# Patient Record
Sex: Male | Born: 1985 | Race: White | Hispanic: No | Marital: Married | Smoking: Never smoker
Health system: Southern US, Community
[De-identification: ages and names within clinical notes are randomized; demographics above are authoritative.]

## PROBLEM LIST (undated history)

## (undated) DIAGNOSIS — I471 Supraventricular tachycardia, unspecified: Secondary | ICD-10-CM

## (undated) DIAGNOSIS — F419 Anxiety disorder, unspecified: Secondary | ICD-10-CM

## (undated) DIAGNOSIS — E785 Hyperlipidemia, unspecified: Secondary | ICD-10-CM

## (undated) HISTORY — DX: Hyperlipidemia, unspecified: E78.5

---

## 2012-07-17 ENCOUNTER — Other Ambulatory Visit: Payer: Self-pay | Admitting: Family Medicine

## 2012-07-17 ENCOUNTER — Ambulatory Visit
Admission: RE | Admit: 2012-07-17 | Discharge: 2012-07-17 | Disposition: A | Discharge status: Home / Self Care | Payer: Worker's Compensation | Source: Ambulatory Visit | Attending: Family Medicine | Admitting: Family Medicine

## 2012-07-17 DIAGNOSIS — R52 Pain, unspecified: Secondary | ICD-10-CM

## 2013-04-15 ENCOUNTER — Emergency Department: Payer: Self-pay | Admitting: Emergency Medicine

## 2014-07-07 ENCOUNTER — Ambulatory Visit: Payer: Self-pay

## 2014-07-07 ENCOUNTER — Other Ambulatory Visit: Payer: Self-pay | Admitting: Occupational Medicine

## 2014-07-07 DIAGNOSIS — M25562 Pain in left knee: Secondary | ICD-10-CM

## 2015-05-20 ENCOUNTER — Encounter (HOSPITAL_COMMUNITY): Payer: Self-pay | Admitting: *Deleted

## 2015-05-20 ENCOUNTER — Emergency Department (HOSPITAL_COMMUNITY)
Admission: EM | Admit: 2015-05-20 | Discharge: 2015-05-20 | Disposition: A | Discharge status: Home / Self Care | Payer: No Typology Code available for payment source | Attending: Emergency Medicine | Admitting: Emergency Medicine

## 2015-05-20 DIAGNOSIS — I471 Supraventricular tachycardia: Secondary | ICD-10-CM | POA: Insufficient documentation

## 2015-05-20 DIAGNOSIS — Z79899 Other long term (current) drug therapy: Secondary | ICD-10-CM | POA: Insufficient documentation

## 2015-05-20 DIAGNOSIS — F419 Anxiety disorder, unspecified: Secondary | ICD-10-CM | POA: Diagnosis not present

## 2015-05-20 DIAGNOSIS — Z880 Allergy status to penicillin: Secondary | ICD-10-CM | POA: Diagnosis not present

## 2015-05-20 DIAGNOSIS — R000 Tachycardia, unspecified: Secondary | ICD-10-CM | POA: Diagnosis present

## 2015-05-20 HISTORY — DX: Supraventricular tachycardia, unspecified: I47.10

## 2015-05-20 HISTORY — DX: Anxiety disorder, unspecified: F41.9

## 2015-05-20 HISTORY — DX: Supraventricular tachycardia: I47.1

## 2015-05-20 LAB — BASIC METABOLIC PANEL
ANION GAP: 8 (ref 5–15)
BUN: 10 mg/dL (ref 6–20)
CALCIUM: 8.9 mg/dL (ref 8.9–10.3)
CO2: 25 mmol/L (ref 22–32)
Chloride: 107 mmol/L (ref 101–111)
Creatinine, Ser: 0.85 mg/dL (ref 0.61–1.24)
GFR calc Af Amer: >60 mL/min (ref >60)
GFR calc non Af Amer: >60 mL/min (ref >60)
GLUCOSE: 89 mg/dL (ref 65–99)
POTASSIUM: 3.5 mmol/L (ref 3.5–5.1)
Sodium: 140 mmol/L (ref 135–145)

## 2015-05-20 LAB — I-STAT CHEM 8, ED
BUN: 12 mg/dL (ref 6–20)
CALCIUM ION: 1.18 mmol/L (ref 1.12–1.23)
Chloride: 105 mmol/L (ref 101–111)
Creatinine, Ser: 0.9 mg/dL (ref 0.61–1.24)
Glucose, Bld: 85 mg/dL (ref 65–99)
HCT: 45 % (ref 39.0–52.0)
HEMOGLOBIN: 15.3 g/dL (ref 13.0–17.0)
POTASSIUM: 3.5 mmol/L (ref 3.5–5.1)
SODIUM: 142 mmol/L (ref 135–145)
TCO2: 22 mmol/L (ref 0–100)

## 2015-05-20 LAB — CBC
HEMATOCRIT: 42.2 % (ref 39.0–52.0)
HEMOGLOBIN: 16.2 g/dL (ref 13.0–17.0)
MCH: 31.6 pg (ref 26.0–34.0)
MCHC: 38.4 g/dL — ABNORMAL HIGH (ref 30.0–36.0)
MCV: 82.4 fL (ref 78.0–100.0)
Platelets: 249 10*3/uL (ref 150–400)
RBC: 5.12 MIL/uL (ref 4.22–5.81)
RDW: 13.7 % (ref 11.5–15.5)
WBC: 10.8 10*3/uL — ABNORMAL HIGH (ref 4.0–10.5)

## 2015-05-20 LAB — I-STAT TROPONIN, ED: TROPONIN I, POC: 0 ng/mL (ref 0.00–0.08)

## 2015-05-20 NOTE — ED Notes (Signed)
Pt with hx of svt squatted down and experienced chest pain and felt his heart racing.  When EMS arrived pt hr was 177.  Pt was encouraged to drink 3 L water.  As soon as pt began drinking his HR dropped.  Orthostatic positive per EMS.  1L NS infusing.

## 2015-05-20 NOTE — ED Notes (Signed)
Pt stood up to use urinal.  HR elevated only to 102, ST.

## 2015-05-20 NOTE — ED Provider Notes (Signed)
CSN: [id number]     Arrival date & time 05/20/15  1306 History   First MD Initiated Contact with Patient 05/20/15 1321     Chief Complaint  Patient presents with  . Tachycardia  . Chest Pain     (Consider location/radiation/quality/duration/timing/severity/associated sxs/prior Treatment) HPI [name] is a 29 y.o. male with a past medical hx of anxiety and supraventricular tachycardia presenting to the emergency department for chest pain and tachycardia.  Pt states that while he was working he bent down to pick up something and upon standing immediately felt chest pain described as dull with a feeling like his heart was in his throat.  States he could feel his heart racing and was associated with dizziness and lightheadedness.  States he never lost consciousness but symptoms remained until he was in the ambulance and drinking water.  Past medical hx does include anxiety attacks but states that this was different than past anxiety attacks because of sudden onset.  He does have a prior history of SVT for which he was seen once before in the emergency room.  States he followed up with cardiology for this and was told that it was likely related to dehydration.  Pt denies any nausea or vomiting, shortness of breath, swelling in his extremity.    Past Medical History  Diagnosis Date  . SVT (supraventricular tachycardia)   . Anxiety    History reviewed. No pertinent past surgical history. No family history on file. History  Substance Use Topics  . Smoking status: Never Smoker   . Smokeless tobacco: Not on file  . Alcohol Use: No    Review of Systems  Constitutional: Negative for fever and chills.  Respiratory: Negative for cough and shortness of breath.   Cardiovascular: Positive for chest pain and palpitations. Negative for leg swelling.  Gastrointestinal: Negative for nausea, vomiting and abdominal pain.  Genitourinary: Negative for difficulty urinating.  Neurological: Positive  for dizziness and light-headedness. Negative for syncope and headaches.  All other systems reviewed and are negative.     Allergies  Penicillins  Home Medications   Prior to Admission medications   Medication Sig Start Date End Date Taking? Authorizing Provider  escitalopram (LEXAPRO) 10 MG tablet Take 1 tablet by mouth daily. 05/18/15  Yes Historical Provider, MD  omeprazole (PRILOSEC) 20 MG capsule Take 20 mg by mouth daily.   Yes Historical Provider, MD   BP 117/47 mmHg  Pulse 87  Temp(Src) 98.3 F (36.8 C) (Oral)  Resp 19  Ht 5' 8.5" (1.74 m)  Wt 311 lb (141.069 kg)  BMI 46.59 kg/m2  SpO2 99% Physical Exam  Constitutional: He appears well-developed and well-nourished.  HENT:  Head: Normocephalic and atraumatic.  Eyes: EOM are normal.  Cardiovascular: Normal rate, regular rhythm and intact distal pulses.   Pulmonary/Chest: Effort normal and breath sounds normal. He has no wheezes. He has no rales.  Abdominal: Soft. Bowel sounds are normal.  Musculoskeletal: He exhibits no edema.  Neurological: He is alert. No cranial nerve deficit.  Skin: Skin is warm and intact.  Psychiatric: He has a normal mood and affect. His speech is normal and behavior is normal.    ED Course  Procedures (including critical care time) Labs Review Labs Reviewed  BASIC METABOLIC PANEL  CBC  I-STAT CHEM 8, ED  I-STAT TROPOININ, ED    Imaging Review No results found.   EKG Interpretation None      MDM   Final diagnoses:  None  29 y.o. male with past medical history of anxiety and SVT presenting with tachycardia and chest pain.  Per EMS, pt heart rate was 177 on arrival with subsequent drop after patient started drinking water.  Currently pt is not tachycardic, not experiencing chest pain or shortness of breath.  He is receiving IV fluids.  Initial troponin negative, BMP unremarkable.  Plan for patient to be discharged to home with recommendation for close follow up outpatient with  cardiology.    Gwynn Burly, DO 05/20/15 1437  Nelva Nay, MD 05/20/15 [phone]

## 2015-05-29 NOTE — Progress Notes (Signed)
Cardiology Office Note   Date:  05/30/2015   ID:  Edward Atkins, DOB [birth year], MRN [id number]  PCP:  No primary care provider on file.  Cardiologist:   Madilyn Hook, MD   Chief Complaint  Patient presents with  . Follow-up    SVT  . Leg Pain    burning on the back of lower legs; pt states no other Sx since hospital      History of Present Illness: Edward Atkins is a 29 y.o. male with anxiety and SVT who presents for hospital follow up.  [name] was at work 2 weeks ago and developed an episode of tachycardia after lifting a heavy barrel. The time he felt lightheaded and dizzy as well as short of breath. He did not pass out. The episode lasted approximately 30 minutes. EMS was called and transported him to the emergency department.  Upon their arrival his HR was reportedly 177 bpm.  The SVT broke with drinking water.  Upon arrival in the ED his HR was 87 bpm and sinus.  He was hemodynamically stable. He was thought to be slightly dehydrated at the time.  [name] brought a copy of his heart rhythm on his cell phone that shows a long R-P tachycardia at a rate slightly over 150 bpm.  It broke with a PAC.  [name] had one episode of SVT 6 years ago.  He previously followed up with Cardiology and underwent treadmill stress testing that was reportedly normal. He was told that this was a fluke accident and that it would likely never occur again. He did not start any preventative medication at that time.    [name] weight has been stable. He tries to eat fruits and vegetables but admits that his diet could be improved. Since SVT episode he is trying to cut back on sodas as he was drinking Coke regularly. He is currently drinking tea water and juice. 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   Past Medical History  Diagnosis Date  . SVT (supraventricular tachycardia)   . Anxiety   . Diabetes mellitus  without complication mother and father  . Hyperlipidemia mother  . Hypertension father    No past surgical history on file.   Current Outpatient Prescriptions  Medication Sig Dispense Refill  . escitalopram (LEXAPRO) 10 MG tablet Take 1 tablet by mouth daily.    Marland Kitchen omeprazole (PRILOSEC) 20 MG capsule Take 20 mg by mouth daily.     No current facility-administered medications for this visit.    Allergies:   Penicillins    Social History:  The patient  reports that he has never smoked. He does not have any smokeless tobacco history on file. He reports that he does not drink alcohol or use illicit drugs.   Family History:  The patient's family history includes Heart attack in his maternal grandfather; Hyperlipidemia in his mother; Hypertension in his father.    ROS:  Please see the history of present illness.   Otherwise, review of systems are positive for bilateral leg pain at after work.  Bilateral shoulder pain with work.   All other systems are reviewed and negative.    PHYSICAL EXAM: VS:  BP 130/80 mmHg  Pulse 59  Ht  (1.727 m)  Wt 141.477 kg (311 lb 14.4 oz)  BMI 47.44 kg/m2 , BMI Body mass index is 47.44 kg/(m^2). GENERAL:  Well  appearing.  Obese HEENT:  Pupils equal round and reactive, fundi not visualized, oral mucosa unremarkable NECK:  No jugular venous distention, waveform within normal limits, carotid upstroke brisk and symmetric, no bruits, no thyromegaly LYMPHATICS:  No cervical adenopathy LUNGS:  Clear to auscultation bilaterally HEART:  RRR.  PMI not displaced or sustained,S1 and S2 within normal limits, no S3, no S4, no clicks, no rubs, no murmurs ABD:  Flat, positive bowel sounds normal in frequency in pitch, no bruits, no rebound, no guarding, no midline pulsatile mass, no hepatomegaly, no splenomegaly EXT:  2 plus pulses throughout, no edema, no cyanosis no clubbing SKIN:  No rashes no nodules NEURO:  Cranial nerves II through XII grossly intact, motor  grossly intact throughout PSYCH:  Cognitively intact, oriented to person place and time    EKG:  EKG is ordered today. The ekg ordered today demonstrates sinus bradycardia at 59 bpm.     Recent Labs: 05/20/2015: BUN 12; Creatinine, Ser 0.90; Hemoglobin 15.3; Platelets 249; Potassium 3.5; Sodium 142    Lipid Panel No results found for: CHOL, TRIG, HDL, CHOLHDL, VLDL, LDLCALC, LDLDIRECT    Wt Readings from Last 3 Encounters:  05/30/15 141.477 kg (311 lb 14.4 oz)  05/20/15 141.069 kg (311 lb)      Other studies Reviewed: Additional studies/ records that were reviewed today include: cell phone ECG, medical record. Review of the above records demonstrates:  Please see elsewhere in the note.     ASSESSMENT AND PLAN:  # SVT: This is his second episode of SVT, with the first one occurring 6 years ago.  Caffeine and dehydration may have been precipitants.  He has cut back on the Coke, and I reminded him that tea can have caffeine as well.  We discussed the three options of watch and wait, prophylactic medication, and ablation.  He prefers to try metoprolol.  We discussed the possible side effects of fatigue and sexual dysfunction.  We also discussed the use of vagal maneuvers.   - metoprolol tartrate 12.5mg  bid - reduce caffeine intake  # Morbid obesity: We discussed the need to lose weight.  Patient is working to increase exercise and improve diet.  We reviewed the recommendation for at least 30-40 minutes of vigorous exercise most days of the week.   Current medicines are reviewed at length with the patient today.  The patient does not have concerns regarding medicines.  The following changes have been made:  Start metoprolol  Labs/ tests ordered today include: none  Orders Placed This Encounter  Procedures  . EKG 12-Lead     Disposition:   FU with Dr. Elmarie Shiley C.  in 6 months    Signed, Madilyn Hook, MD  05/30/2015 9:27 AM    [facility]

## 2015-05-30 ENCOUNTER — Encounter: Payer: Self-pay | Admitting: Cardiovascular Disease

## 2015-05-30 ENCOUNTER — Ambulatory Visit (INDEPENDENT_AMBULATORY_CARE_PROVIDER_SITE_OTHER): Payer: No Typology Code available for payment source | Admitting: Cardiovascular Disease

## 2015-05-30 VITALS — BP 130/80 | HR 59 | Ht 68.0 in | Wt 311.9 lb

## 2015-05-30 DIAGNOSIS — I471 Supraventricular tachycardia: Secondary | ICD-10-CM

## 2015-05-30 MED ORDER — METOPROLOL TARTRATE 25 MG PO TABS: 12.5000 mg | ORAL_TABLET | Freq: Two times a day (BID) | ORAL | Status: DC

## 2015-05-30 NOTE — Patient Instructions (Signed)
Start metoprolol tartrate  12.5 mg ( 1/2 of a 25 mg tablet)   Twice a day.   Your physician wants you to follow-up in 6 months Dr Duke Salvia.  You will receive a reminder letter in the mail two months in advance. If you don't receive a letter, please call our office to schedule the follow-up appointment.

## 2015-12-01 ENCOUNTER — Ambulatory Visit: Payer: Self-pay | Admitting: Cardiovascular Disease

## 2015-12-13 ENCOUNTER — Encounter: Payer: Self-pay | Admitting: Cardiovascular Disease

## 2015-12-13 NOTE — Progress Notes (Signed)
Cardiology Office Note   Date:  12/13/2015   ID:  Edward Atkins, DOB [birth year], MRN [id number]  PCP:  No primary care provider on file.  Cardiologist:   Madilyn Hook, MD   No chief complaint on file.     Patient ID: Edward Atkins is a 30 y.o. male with anxiety and SVT who presents for hospital follow up.     Interval History 12/13/15: At his last appointment Edward Atkins was advised to.  ?diet and exercise  History of Present Illness 05/30/15: Edward Atkins was at work 2 weeks ago and developed an episode of tachycardia after lifting a heavy barrel. The time he felt lightheaded and dizzy as well as short of breath. He did not pass out. The episode lasted approximately 30 minutes. EMS was called and transported him to the emergency department.  Upon their arrival his HR was reportedly 177 bpm.  The SVT broke with drinking water.  Upon arrival in the ED his HR was 87 bpm and sinus.  He was hemodynamically stable. He was thought to be slightly dehydrated at the time.  Edward Atkins brought a copy of his heart rhythm on his cell phone that shows a long R-P tachycardia at a rate slightly over 150 bpm.  It broke with a PAC.  Edward Atkins had one episode of SVT 6 years ago.  He previously followed up with Cardiology and underwent treadmill stress testing that was reportedly normal. He was told that this was a fluke accident and that it would likely never occur again. He did not start any preventative medication at that time.    Edward Atkins weight has been stable. He tries to eat fruits and vegetables but admits that his diet could be improved. Since SVT episode he is trying to cut back on sodas as he was drinking Coke regularly. He is currently drinking tea water and juice. 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   Past Medical History  Diagnosis Date  . SVT (supraventricular tachycardia)   . Anxiety   . Diabetes mellitus  without complication mother and father  . Hyperlipidemia mother  . Hypertension father    No past surgical history on file.   Current Outpatient Prescriptions  Medication Sig Dispense Refill  . escitalopram (LEXAPRO) 10 MG tablet Take 1 tablet by mouth daily.    . metoprolol tartrate (LOPRESSOR) 25 MG tablet Take 0.5 tablets (12.5 mg total) by mouth 2 (two) times daily. 90 tablet 3  . omeprazole (PRILOSEC) 20 MG capsule Take 20 mg by mouth daily.     No current facility-administered medications for this visit.    Allergies:   Penicillins    Social History:  The patient  reports that he has never smoked. He does not have any smokeless tobacco history on file. He reports that he does not drink alcohol or use illicit drugs.   Family History:  The patient's family history includes Heart attack in his maternal grandfather; Hyperlipidemia in his mother; Hypertension in his father.    ROS:  Please see the history of present illness.   Otherwise, review of systems are positive for bilateral leg pain at after work.  Bilateral shoulder pain with work.   All other systems are reviewed and negative.    PHYSICAL EXAM: VS:  There were no vitals taken for this visit. , BMI There is no weight on file to calculate BMI.  GENERAL:  Well appearing.  Obese HEENT:  Pupils equal round and reactive, fundi not visualized, oral mucosa unremarkable NECK:  No jugular venous distention, waveform within normal limits, carotid upstroke brisk and symmetric, no bruits, no thyromegaly LYMPHATICS:  No cervical adenopathy LUNGS:  Clear to auscultation bilaterally HEART:  RRR.  PMI not displaced or sustained,S1 and S2 within normal limits, no S3, no S4, no clicks, no rubs, no murmurs ABD:  Flat, positive bowel sounds normal in frequency in pitch, no bruits, no rebound, no guarding, no midline pulsatile mass, no hepatomegaly, no splenomegaly EXT:  2 plus pulses throughout, no edema, no cyanosis no clubbing SKIN:  No  rashes no nodules NEURO:  Cranial nerves II through XII grossly intact, motor grossly intact throughout PSYCH:  Cognitively intact, oriented to person place and time    EKG:  EKG is ordered today. The ekg ordered today demonstrates sinus bradycardia at 59 bpm.     Recent Labs: 05/20/2015: BUN 12; Creatinine, Ser 0.90; Hemoglobin 15.3; Platelets 249; Potassium 3.5; Sodium 142    Lipid Panel No results found for: CHOL, TRIG, HDL, CHOLHDL, VLDL, LDLCALC, LDLDIRECT    Wt Readings from Last 3 Encounters:  05/30/15 141.477 kg (311 lb 14.4 oz)  05/20/15 141.069 kg (311 lb)      Other studies Reviewed: Additional studies/ records that were reviewed today include: cell phone ECG, medical record. Review of the above records demonstrates:  Please see elsewhere in the note.     ASSESSMENT AND PLAN:  # SVT: This is his second episode of SVT, with the first one occurring 6 years ago.  Caffeine and dehydration may have been precipitants.  He has cut back on the Coke, and I reminded him that tea can have caffeine as well.  We discussed the three options of watch and wait, prophylactic medication, and ablation.  He prefers to try metoprolol.  We discussed the possible side effects of fatigue and sexual dysfunction.  We also discussed the use of vagal maneuvers.   - metoprolol tartrate 12.5mg  bid - reduce caffeine intake  # Morbid obesity: We discussed the need to lose weight.  Patient is working to increase exercise and improve diet.  We reviewed the recommendation for at least 30-40 minutes of vigorous exercise most days of the week.   Current medicines are reviewed at length with the patient today.  The patient does not have concerns regarding medicines.  The following changes have been made:  Start metoprolol  Labs/ tests ordered today include: none  No orders of the defined types were placed in this encounter.     Disposition:   FU with Dr. Elmarie Shiley C. [place] in 6 months     Signed, Madilyn Hook, MD  12/13/2015 7:58 AM    [facility] This encounter was created in error - please disregard.

## 2015-12-14 ENCOUNTER — Encounter: Payer: Self-pay | Admitting: *Deleted

## 2015-12-15 ENCOUNTER — Emergency Department
Admission: EM | Admit: 2015-12-15 | Discharge: 2015-12-15 | Disposition: A | Discharge status: Home / Self Care | Payer: Worker's Compensation | Attending: Emergency Medicine | Admitting: Emergency Medicine

## 2015-12-15 ENCOUNTER — Emergency Department: Payer: Worker's Compensation

## 2015-12-15 ENCOUNTER — Emergency Department
Admission: EM | Admit: 2015-12-15 | Discharge: 2015-12-15 | Discharge status: Absent without leave | Payer: Self-pay | Attending: Emergency Medicine | Admitting: Emergency Medicine

## 2015-12-15 DIAGNOSIS — E119 Type 2 diabetes mellitus without complications: Secondary | ICD-10-CM | POA: Diagnosis not present

## 2015-12-15 DIAGNOSIS — Z575 Occupational exposure to toxic agents in other industries: Secondary | ICD-10-CM | POA: Insufficient documentation

## 2015-12-15 DIAGNOSIS — I10 Essential (primary) hypertension: Secondary | ICD-10-CM | POA: Insufficient documentation

## 2015-12-15 DIAGNOSIS — Z79899 Other long term (current) drug therapy: Secondary | ICD-10-CM | POA: Insufficient documentation

## 2015-12-15 DIAGNOSIS — Z880 Allergy status to penicillin: Secondary | ICD-10-CM | POA: Diagnosis not present

## 2015-12-15 DIAGNOSIS — Z77098 Contact with and (suspected) exposure to other hazardous, chiefly nonmedicinal, chemicals: Secondary | ICD-10-CM

## 2015-12-15 NOTE — ED Notes (Signed)
Pt's supervisor reports he does not want WC done on patient here; they have their own [facility] company and will be going there after DC

## 2015-12-15 NOTE — ED Notes (Signed)
Pt's bilateral eyes rinsed with 1L of NS per eye with morgan lenses. Pt showered and hydraulic fluid washed from hair.

## 2015-12-15 NOTE — ED Notes (Signed)
PT States he was at work at and some hydraulic fluid spelled out into his mouth and eyes, states he accidentally ingested some and his stomach is cramping with nausea, states he washed his eyes out with 3 bottle of water and local fire station rinsed with saline solution.Marland Kitchen

## 2015-12-15 NOTE — Discharge Instructions (Signed)
Please return if you're worse tonight that includes worse burning in the eyes coughing shortness of breath bad abdominal pain or any other problems. Remember that the ingestion of the hydraulic fluid probably will give you diarrhea he don't have to return for that unless the diarrhea will not stop. The eye doctor wants to see what 8:15 tomorrow you have an appointment already please keep it thank you

## 2015-12-15 NOTE — ED Provider Notes (Signed)
[facility] Emergency Department Provider Note  ____________________________________________  Time seen: Approximately 2:10 PM  I have reviewed the triage vital signs and the nursing notes.   HISTORY  Chief Complaint Chemical Exposure  Complaint is chemical exposure  HPI Edward Atkins is a 30 y.o. male patient was working on a large caterpillar when the hydraulic fluid came out and splashed on his face got in his eyes and swallowed some. Patient does not believe he aspirated any but he's been coughing. Patient's medical problems are listed below he does not have diabetes or hypertension and family history  Past Medical History  Diagnosis Date  . SVT (supraventricular tachycardia)   . Anxiety   . Diabetes mellitus without complication mother and father  . Hyperlipidemia mother  . Hypertension father    There are no active problems to display for this patient.   No past surgical history on file.  Current Outpatient Rx  Name  Route  Sig  Dispense  Refill  . escitalopram (LEXAPRO) 10 MG tablet   Oral   Take 1 tablet by mouth daily.         . metoprolol tartrate (LOPRESSOR) 25 MG tablet   Oral   Take 0.5 tablets (12.5 mg total) by mouth 2 (two) times daily.   90 tablet   3   . omeprazole (PRILOSEC) 20 MG capsule   Oral   Take 20 mg by mouth daily.           Allergies Penicillins  Family History  Problem Relation Age of Onset  . Hyperlipidemia Mother   . Hypertension Father   . Heart attack Maternal Grandfather     Social History Social History  Substance Use Topics  . Smoking status: Never Smoker   . Smokeless tobacco: Not on file  . Alcohol Use: No    Review of Systems Constitutional: No fever/chills Eyes: No visual changes. ENT: No sore throat. Cardiovascular: Denies chest pain. Respiratory: Denies shortness of breath. Gastrointestinal: No abdominal pain.  No nausea, no vomiting.  No diarrhea.  No  constipation. Genitourinary: Negative for dysuria. Musculoskeletal: Negative for back pain. Skin: Negative for rash. Neurological: Negative for headaches, focal weakness or numbness.  10-point ROS otherwise negative.  ____________________________________________   PHYSICAL EXAM:  VITAL SIGNS: ED Triage Vitals  Enc Vitals Group     BP --      Pulse --      Resp --      Temp --      Temp src --      SpO2 --      Weight --      Height --      Head Cir --      Peak Flow --      Pain Score --      Pain Loc --      Pain Edu? --      Excl. in GC? --     Constitutional: Alert and oriented. Well appearing and in no acute distress. Eyes: Conjunctivae are normal. PERRL. EOMI. Head: Atraumatic. Nose: No congestion/rhinnorhea. Mouth/Throat: Mucous membranes are moist.  Oropharynx non-erythematous. Neck: No stridor.  Cardiovascular: Normal rate, regular rhythm. Grossly normal heart sounds.  Good peripheral circulation. Respiratory: Normal respiratory effort.  No retractions. Lungs CTAB. Gastrointestinal: Soft and nontender. No distention. No abdominal bruits. No CVA tenderness. Musculoskeletal: No lower extremity tenderness nor edema.  No joint effusions. Neurologic:  Normal speech and language. No gross focal neurologic deficits are appreciated.  No gait instability. Skin:  Skin is warm, dry and intact. No rash noted. Psychiatric: Mood and affect are normal. Speech and behavior are normal.  ____________________________________________   LABS (all labs ordered are listed, but only abnormal results are displayed)  Labs Reviewed - No data to display ____________________________________________  EKG   ____________________________________________  RADIOLOGY  Chest x-ray read by me as normal radiologist reads chest x-ray later agrees ____________________________________________   PROCEDURES  ____________________________________________   INITIAL IMPRESSION /  ASSESSMENT AND PLAN / ED COURSE  Pertinent labs & imaging results that were available during my care of the patient were reviewed by me and considered in my medical decision making (see chart for details).   ____________________________________________   FINAL CLINICAL IMPRESSION(S) / ED DIAGNOSES  Final diagnoses:  Exposure to industrial toxin      Arnaldo Natal, MD 12/15/15 [phone]

## 2015-12-15 NOTE — ED Provider Notes (Signed)
[facility] Emergency Department Provider Note  ____________________________________________  Time seen: Approximately 1:41 PM  I have reviewed the triage vital signs and the nursing notes.   HISTORY  Chief Complaint Chemical Exposure    HPI Edward Atkins is a 30 y.o. male patient reports he was changing a hydraulic fluid on a big caterpillar and something popped and he was doused with hydraulic fluid got out of his hair in the size a minor swallowed some. Poison control recommends irrigating both eyes with Morgan lenses and observing him we will also get a chest x-ray as he is coughing a little bit. Patient is not short of breath has no other injuries.   No past medical history on file.   A couple episodes of SVT patient reports  There are no active problems to display for this patient.   No past surgical history on file.  No current outpatient prescriptions on file.  Allergies Review of patient's allergies indicates not on file.  She denies any allergies No family history on file.  Social History Social History  Substance Use Topics  . Smoking status: Not on file  . Smokeless tobacco: Not on file  . Alcohol Use: Not on file   Patient does not smoke Review of Systems Constitutional: No fever/chills Eyes: No visual changes. ENT: No sore throat. Cardiovascular: Denies chest pain. Respiratory: Denies shortness of breath. Gastrointestinal: No abdominal pain.  No nausea, no vomiting.  No diarrhea.  No constipation. Genitourinary: Negative for dysuria. Musculoskeletal: Negative for back pain. Skin: Negative for rash. Neurological: Negative for headaches, focal weakness or numbness.  10-point ROS otherwise negative.  ____________________________________________   PHYSICAL EXAM:  VITAL SIGNS: ED Triage Vitals  Enc Vitals Group     BP 12/15/15 1324 152/63 mmHg     Pulse Rate 12/15/15 1324 86     Resp 12/15/15 1324 18     Temp  12/15/15 1324 98.1 F (36.7 C)     Temp Source 12/15/15 1324 Oral     SpO2 12/15/15 1324 100 %     Weight 12/15/15 1324 300 lb (136.079 kg)     Height 12/15/15 1324  (1.727 m)     Head Cir --      Peak Flow --      Pain Score 12/15/15 1324 5     Pain Loc --      Pain Edu? --      Excl. in GC? --     Constitutional: Alert and oriented. Well appearing and in no acute distress. Eyes: Conjunctivae are normal. PERRL. EOMI. Head: Atraumatic. Patient has hydraulic fluid in his hair Nose: No congestion/rhinnorhea. Mouth/Throat: Mucous membranes are moist.  Oropharynx non-erythematous. Neck: No stridor.Cardiovascular: Normal rate, regular rhythm. Grossly normal heart sounds.  Good peripheral circulation. Respiratory: Normal respiratory effort.  No retractions. Lungs CTAB. Gastrointestinal: Soft and nontender. No distention. No abdominal bruits. No CVA tenderness. Musculoskeletal: No lower extremity tenderness nor edema.  No joint effusions. Neurologic:  Normal speech and language. No gross focal neurologic deficits are appreciated. No gait instability. Skin:  Skin is warm, dry and intact. No rash noted. Psychiatric: Mood and affect are normal. Speech and behavior are normal.  ____________________________________________   LABS (all labs ordered are listed, but only abnormal results are displayed)  Labs Reviewed - No data to display ____________________________________________  EKG   ____________________________________________  RADIOLOGY   ____________________________________________   PROCEDURES    ____________________________________________   INITIAL IMPRESSION / ASSESSMENT AND PLAN /  ED COURSE  Pertinent labs & imaging results that were available during my care of the patient were reviewed by me and considered in my medical decision making (see chart for details).   ____________________________________________   FINAL CLINICAL IMPRESSION(S) / ED  DIAGNOSES  Final diagnoses:  Exposure to industrial toxin      Arnaldo Natal, MD 12/15/15 1539

## 2015-12-15 NOTE — ED Notes (Signed)
Spoke with [name] at poison control who states using a morgan lens rinse continuous for with saline or warm water. Observe pt for drowsiness, N/V abd pain and aspiration s/sx.. Pt is to avoid smoking or flammable substances for at least 6 hrs. NPO for another hour.. Pt states he has already rinsed his mouth and drank water PTA.Edward Atkins

## 2016-06-23 ENCOUNTER — Emergency Department: Payer: 59

## 2016-06-23 ENCOUNTER — Emergency Department
Admission: EM | Admit: 2016-06-23 | Discharge: 2016-06-23 | Disposition: A | Discharge status: Home / Self Care | Payer: 59 | Attending: Emergency Medicine | Admitting: Emergency Medicine

## 2016-06-23 ENCOUNTER — Encounter: Payer: Self-pay | Admitting: Emergency Medicine

## 2016-06-23 DIAGNOSIS — E119 Type 2 diabetes mellitus without complications: Secondary | ICD-10-CM | POA: Diagnosis not present

## 2016-06-23 DIAGNOSIS — I10 Essential (primary) hypertension: Secondary | ICD-10-CM | POA: Diagnosis not present

## 2016-06-23 DIAGNOSIS — R002 Palpitations: Secondary | ICD-10-CM | POA: Diagnosis not present

## 2016-06-23 LAB — CBC
HCT: 45.2 % (ref 40.0–52.0)
Hemoglobin: 16.5 g/dL (ref 13.0–18.0)
MCH: 31.5 pg (ref 26.0–34.0)
MCHC: 36.5 g/dL — ABNORMAL HIGH (ref 32.0–36.0)
MCV: 86.3 fL (ref 80.0–100.0)
PLATELETS: 258 10*3/uL (ref 150–440)
RBC: 5.23 MIL/uL (ref 4.40–5.90)
RDW: 13.6 % (ref 11.5–14.5)
WBC: 13.8 10*3/uL — ABNORMAL HIGH (ref 3.8–10.6)

## 2016-06-23 LAB — BASIC METABOLIC PANEL
Anion gap: 5 (ref 5–15)
BUN: 13 mg/dL (ref 6–20)
CHLORIDE: 107 mmol/L (ref 101–111)
CO2: 28 mmol/L (ref 22–32)
CREATININE: 0.88 mg/dL (ref 0.61–1.24)
Calcium: 8.8 mg/dL — ABNORMAL LOW (ref 8.9–10.3)
GFR calc Af Amer: >60 mL/min (ref >60)
GFR calc non Af Amer: >60 mL/min (ref >60)
Glucose, Bld: 91 mg/dL (ref 65–99)
Potassium: 3.3 mmol/L — ABNORMAL LOW (ref 3.5–5.1)
Sodium: 140 mmol/L (ref 135–145)

## 2016-06-23 LAB — TROPONIN I: Troponin I: <0.03 ng/mL (ref <0.03)

## 2016-06-23 NOTE — ED Provider Notes (Signed)
[facility] Emergency Department Provider Note  ____________________________________________   First MD Initiated Contact with Patient 06/23/16 1730     (approximate)  I have reviewed the triage vital signs and the nursing notes.   HISTORY  Chief Complaint Palpitations    HPI Kees Idrovo is a 30 y.o. male with a history of anxiety and SVT for which she saw a cardiologist previously and was put on metoprolol but stopped taking it because it made him feel ill.  He presents today after an episode that he believes was SVT characterized by rapid heartbeat, chest discomfort, and some shortness of breath.  It all resolvedby the time EMS arrived to bring him to the emergency department.  He states that he did not get much sleep last night and drank a lot of caffeine earlier today and it occurred while he was driving.  He states that when it happens his anxiety gets worse.  He currently is asymptomatic.  He has not been ill recently.  He denies the use of alcohol, tobacco, and drugs including cocaine.  He describes the symptoms as severe when it happened, acute in onset, but now resolved.   Past Medical History:  Diagnosis Date  . Anxiety   . Diabetes mellitus without complication [facility]) mother and father  . Hyperlipidemia mother  . Hypertension father  . SVT (supraventricular tachycardia) (HCC)     There are no active problems to display for this patient.   History reviewed. No pertinent surgical history.  Prior to Admission medications   Medication Sig Start Date End Date Taking? Authorizing Provider  escitalopram (LEXAPRO) 10 MG tablet Take 10 mg by mouth every morning.  05/18/15  Yes Historical Provider, MD  omeprazole (PRILOSEC) 20 MG capsule Take 20 mg by mouth every morning.    Yes Historical Provider, MD  metoprolol tartrate (LOPRESSOR) 25 MG tablet Take 0.5 tablets (12.5 mg total) by mouth 2 (two) times daily. Patient not taking: Reported on 06/23/2016  05/30/15   Chilton Si, MD    Allergies Penicillins  Family History  Problem Relation Age of Onset  . Hyperlipidemia Mother   . Hypertension Father   . Heart attack Maternal Grandfather     Social History Social History  Substance Use Topics  . Smoking status: Never Smoker  . Smokeless tobacco: Not on file  . Alcohol use No    Review of Systems Constitutional: No fever/chills Eyes: No visual changes. ENT: No sore throat. Cardiovascular: +chest discomfort during palpitations Respiratory: mild shortness of breath with palpitations Gastrointestinal: No abdominal pain.  No nausea, no vomiting.  No diarrhea.  No constipation. Genitourinary: Negative for dysuria. Musculoskeletal: Negative for back pain. Skin: Negative for rash. Neurological: Negative for headaches, focal weakness or numbness.  10-point ROS otherwise negative.  ____________________________________________   PHYSICAL EXAM:  VITAL SIGNS: ED Triage Vitals  Enc Vitals Group     BP 06/23/16 1716 131/78     Pulse Rate 06/23/16 1716 77     Resp 06/23/16 1716 18     Temp --      Temp src --      SpO2 06/23/16 1716 99 %     Weight 06/23/16 1533 (!) 315 lb (142.9 kg)     Height 06/23/16 1533 5\' 8"  (1.727 m)     Head Circumference --      Peak Flow --      Pain Score --      Pain Loc --  Pain Edu? --      Excl. in GC? --     Constitutional: Alert and oriented. Well appearing and in no acute distress. Eyes: Conjunctivae are normal. PERRL. EOMI. Head: Atraumatic. Nose: No congestion/rhinnorhea. Mouth/Throat: Mucous membranes are moist.  Oropharynx non-erythematous. Neck: No stridor.  No meningeal signs.   Cardiovascular: Normal rate, regular rhythm. Good peripheral circulation. Grossly normal heart sounds. Respiratory: Normal respiratory effort.  No retractions. Lungs CTAB. Gastrointestinal: Soft and nontender. No distention.  Musculoskeletal: No lower extremity tenderness nor edema. No gross  deformities of extremities. Neurologic:  Normal speech and language. No gross focal neurologic deficits are appreciated.  Skin:  Skin is warm, dry and intact. No rash noted. Psychiatric: Mood and affect are normal. Speech and behavior are normal.  ____________________________________________   LABS (all labs ordered are listed, but only abnormal results are displayed)  Labs Reviewed  BASIC METABOLIC PANEL - Abnormal; Notable for the following:       Result Value   Potassium 3.3 (*)    Calcium 8.8 (*)    All other components within normal limits  CBC - Abnormal; Notable for the following:    WBC 13.8 (*)    MCHC 36.5 (*)    All other components within normal limits  TROPONIN I   ____________________________________________  EKG  ED ECG REPORT I, Maxten Shuler, Erroll, the attending physician, personally viewed and interpreted this ECG.  Date: 06/23/2016 EKG Time: 15:58 Rate: 78 Rhythm: Incomplete right bundle branch block QRS Axis: normal Intervals: normal ST/T Wave abnormalities: normal Conduction Disturbances: none Narrative Interpretation: unremarkable without any indication of WPW or Brugada  ____________________________________________  RADIOLOGY   Dg Chest 2 View  Result Date: 06/23/2016 CLINICAL DATA:  Patient states that he was driving when he said he had an acute onset of "high heart rate". Patient called 911 and was noted to have a normal HR upon their initial contact. Patient states he did not sleep "but three hours" drank a lot of caffeine. History of SVT. EXAM: CHEST  2 VIEW COMPARISON:  12/15/2015 FINDINGS: The heart size and mediastinal contours are within normal limits. Both lungs are clear. The visualized skeletal structures are unremarkable. IMPRESSION: No active cardiopulmonary disease. Electronically Signed   By: Norva PavlovElizabeth  Brown M.D.   On: 06/23/2016 16:27    ____________________________________________   PROCEDURES  Procedure(s) performed:    Procedures   Critical Care performed: No ____________________________________________   INITIAL IMPRESSION / ASSESSMENT AND PLAN / ED COURSE  Pertinent labs & imaging results that were available during my care of the patient were reviewed by me and considered in my medical decision making (see chart for details).  History suggest SVT although his heart rate has been normal and normal sinus rhythm since coming to the emergency department.  He states that he drank "a LOT of [employer]ountain Dew" and states that this felt just like prior episodes of SVT.  PERC neg, nothing to suggest ACS.  I had my usual and customary SVT discussion and he will consider following up with a cardiologist.    ____________________________________________  FINAL CLINICAL IMPRESSION(S) / ED DIAGNOSES  Final diagnoses:  Palpitations     MEDICATIONS GIVEN DURING THIS VISIT:  Medications - No data to display   NEW OUTPATIENT MEDICATIONS STARTED DURING THIS VISIT:  New Prescriptions   No medications on file      Note:  This document was prepared using Dragon voice recognition software and may include unintentional dictation errors.    Loleta Roseory Baer Hinton,  MD 06/23/16 1826

## 2016-06-23 NOTE — Discharge Instructions (Signed)
As we discussed, your signs and symptoms strongly suggest a condition called paroxysmal supraventricular tachycardia (PSVT).  This is a generally benign condition, but we recommend you follow up with the listed cardiologist for further evaluation.  Please read through the included information and try some of the techniques we discussed the next time you have the symptoms. ° °Return to the Emergency Department if you develop new or worsening symptoms that concern you. °

## 2016-06-23 NOTE — ED Notes (Signed)
MD at bedside. 

## 2016-06-23 NOTE — ED Triage Notes (Addendum)
Patient states that he was driving when he said he had an acute onset of "high heart rate". Patient called 911 and was noted to have a normal HR upon their initial contact. EMS advised patient drive himself to the ED. Patient states he did not sleep "but three hours" so he "drank a bunch of caffeine". Patient states he has had issues with SVT before and is being seen by a cardiologist at the [facility] [place]. Patient never followed up with cardiology after last episode. Patient denies CP currently.

## 2017-08-28 ENCOUNTER — Other Ambulatory Visit: Payer: Self-pay

## 2017-08-28 ENCOUNTER — Emergency Department
Admission: EM | Admit: 2017-08-28 | Discharge: 2017-08-28 | Disposition: A | Discharge status: Home / Self Care | Payer: Worker's Compensation | Attending: Emergency Medicine | Admitting: Emergency Medicine

## 2017-08-28 ENCOUNTER — Encounter: Payer: Self-pay | Admitting: Emergency Medicine

## 2017-08-28 DIAGNOSIS — W240XXA Contact with lifting devices, not elsewhere classified, initial encounter: Secondary | ICD-10-CM | POA: Diagnosis not present

## 2017-08-28 DIAGNOSIS — I10 Essential (primary) hypertension: Secondary | ICD-10-CM | POA: Diagnosis not present

## 2017-08-28 DIAGNOSIS — Z880 Allergy status to penicillin: Secondary | ICD-10-CM | POA: Insufficient documentation

## 2017-08-28 DIAGNOSIS — Z79899 Other long term (current) drug therapy: Secondary | ICD-10-CM | POA: Insufficient documentation

## 2017-08-28 DIAGNOSIS — E119 Type 2 diabetes mellitus without complications: Secondary | ICD-10-CM | POA: Diagnosis not present

## 2017-08-28 DIAGNOSIS — S0101XA Laceration without foreign body of scalp, initial encounter: Secondary | ICD-10-CM | POA: Insufficient documentation

## 2017-08-28 DIAGNOSIS — Y92513 Shop (commercial) as the place of occurrence of the external cause: Secondary | ICD-10-CM | POA: Diagnosis not present

## 2017-08-28 DIAGNOSIS — Y939 Activity, unspecified: Secondary | ICD-10-CM | POA: Diagnosis not present

## 2017-08-28 DIAGNOSIS — Y990 Civilian activity done for income or pay: Secondary | ICD-10-CM | POA: Insufficient documentation

## 2017-08-28 MED ORDER — LIDOCAINE HCL (PF) 1 % IJ SOLN
INTRAMUSCULAR | Status: AC
  Filled 2017-08-28: qty 5

## 2017-08-28 MED ORDER — TRAMADOL HCL 50 MG PO TABS: 50.0000 mg | ORAL_TABLET | Freq: Four times a day (QID) | ORAL | 0 refills | Status: DC | PRN

## 2017-08-28 NOTE — ED Provider Notes (Signed)
[facility] Emergency Department Provider Note    ____________________________________________   First MD Initiated Contact with Patient 08/28/17 1150     (approximate)  I have reviewed the triage vital signs and the nursing notes.   HISTORY  Chief Complaint Laceration    HPI Edward Atkins is a 31 y.o. male patient presents with a laceration to occipital area of his scalp. Patient stated he had a contusion of the transmission that he was working on. Patient denies LOC. Patient has patient stated was a vertical. It is controlled with direct pressure. 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 Patient rates his pain as 8/10. Patient had a pain as "achy".   Past Medical History:  Diagnosis Date  . Anxiety   . Diabetes mellitus without complication [facility](HCC) mother and father  . Hyperlipidemia mother  . Hypertension father  . SVT (supraventricular tachycardia) (HCC)     There are no active problems to display for this patient.   History reviewed. No pertinent surgical history.  Prior to Admission medications   Medication Sig Start Date End Date Taking? Authorizing Provider  escitalopram (LEXAPRO) 10 MG tablet Take 10 mg by mouth every morning.  05/18/15   [provider]  metoprolol tartrate (LOPRESSOR) 25 MG tablet Take 0.5 tablets (12.5 mg total) by mouth 2 (two) times daily. Patient not taking: Reported on 06/23/2016 05/30/15   Chilton Siandolph, Tiffany, MD  omeprazole (PRILOSEC) 20 MG capsule Take 20 mg by mouth every morning.     [provider]  traMADol (ULTRAM) 50 MG tablet Take 1 tablet (50 mg total) every 6 (six) hours as needed by mouth for moderate pain. 08/28/17   Joni ReiningSmith, [place] Gipe K, PA-C    Allergies Penicillins  Family History  Problem Relation Age of Onset  . Hyperlipidemia Mother   . Hypertension Father   . Heart attack Maternal Grandfather     Social History Social History   Tobacco Use  . Smoking status: Never Smoker  .  Smokeless tobacco: Never Used  Substance Use Topics  . Alcohol use: No  . Drug use: No    Review of Systems Constitutional: No fever/chills Eyes: No visual changes. ENT: No sore throat. Cardiovascular: Denies chest pain. Respiratory: Denies shortness of breath. Gastrointestinal: No abdominal pain.  No nausea, no vomiting.  No diarrhea.  No constipation. Genitourinary: Negative for dysuria. Musculoskeletal: Negative for back pain. Skin: Negative for rash. Bleeding from scalp laceration Neurological: Negative for headaches, focal weakness or numbness. Psychiatric:Anxiety Endocrine: Diabetes,Hyperlipidemia, and hypertension Allergic/Immunilogical: Penicillin  ____________________________________________   PHYSICAL EXAM:  VITAL SIGNS: ED Triage Vitals [08/28/17 1139]  Enc Vitals Group     BP      Pulse      Resp      Temp      Temp src      SpO2      Weight      Height      Head Circumference      Peak Flow      Pain Score 8     Pain Loc      Pain Edu?      Excl. in GC?     Constitutional: Alert and oriented. Well appearing and in no acute distress. Eyes: Conjunctivae are normal. PERRL. EOMI. Head: Scalp laceration Nose: No congestion/rhinnorhea. Mouth/Throat: Mucous membranes are moist.  Oropharynx non-erythematous. Neck: No stridor.  No cervical spine tenderness to palpation. Hematological/Lymphatic/Immunilogical: No cervical lymphadenopathy. Cardiovascular: Normal rate, regular rhythm. Grossly normal heart  sounds.  Good peripheral circulation. Respiratory: Normal respiratory effort.  No retractions. Lungs CTAB. Neurologic:  Normal speech and language. No gross focal neurologic deficits are appreciated. No gait instability. Skin:  Skin is warm, dry and intact. No rash noted. 3 cm scalp laceration Psychiatric: Mood and affect are normal. Speech and behavior are normal.  ____________________________________________   LABS (all labs ordered are listed, but  only abnormal results are displayed)  Labs Reviewed - No data to display ____________________________________________  EKG   ____________________________________________  RADIOLOGY  No results found.  ____________________________________________   PROCEDURES  Procedure(s) performed: LACERATION REPAIR Performed by: Joni Reiningonald K Harkirat Orozco Authorized by: Joni Reiningonald K Ashlay Altieri Consent: Verbal consent obtained. Risks and benefits: risks, benefits and alternatives were discussed Consent given by: patient Patient identity confirmed: provided demographic data Prepped and Draped in normal sterile fashion Wound explored  Laceration Location: Soft. Left occipital scalp area  Laceration Length: 3cm  No Foreign Bodies seen or palpated  Anesthesia: local infiltration  Local anesthetic: lidocaine 1% without epinephrine  Anesthetic total: 8 ml  Irrigation method: syringe Amount of cleaning: standard  Skin closure: Staples Number of sutures: 11 staples Patient tolerance: Patient tolerated the procedure well with no immediate complications.   Procedures  Critical Care performed: No  ____________________________________________   INITIAL IMPRESSION / ASSESSMENT AND PLAN / ED COURSE  As part of my medical decision making, I reviewed the following data within the electronic MEDICAL RECORD NUMBER    Patient's present. Laceration. Wound was cleaned and stapled. Patient given discharge care instructions. Patient advised take medications as needed for pain. Patient advised return back in 10 days for suture removal.      ____________________________________________   FINAL CLINICAL IMPRESSION(S) / ED DIAGNOSES  Final diagnoses:  Scalp laceration, initial encounter     ED Discharge Orders        Ordered    traMADol (ULTRAM) 50 MG tablet  Every 6 hours PRN     08/28/17 1218       Note:  This document was prepared using Dragon voice recognition software and may include  unintentional dictation errors.    Joni ReiningSmith, Lamonta Cypress K, PA-C 08/28/17 1225    Joni ReiningSmith, Emary Zalar K, PA-C 08/28/17 1240    Emily FilbertWilliams, Jonathan E, MD 08/28/17 [id number]

## 2017-08-28 NOTE — ED Notes (Signed)
PT states he is to have an alcohol test done as well. Pt request for RN to clarify with employer to confirm. RN contacted [employer], and spoke with Bjorn LoserRhonda at [phone] to inquire on the ETOH testing. Bjorn LoserRhonda stated she wanted a breath analysis.

## 2017-08-28 NOTE — ED Triage Notes (Signed)
From Work , Recruitment consultanthead lac , supervisor with pt requiring W/C protocol.

## 2017-08-28 NOTE — ED Notes (Signed)
Lac to scalp , struck a jack while under a car, no LOC , pt ambulatory with a steady gait

## 2017-08-30 ENCOUNTER — Encounter: Payer: Self-pay | Admitting: Emergency Medicine

## 2017-08-30 ENCOUNTER — Emergency Department: Payer: Worker's Compensation

## 2017-08-30 ENCOUNTER — Other Ambulatory Visit: Payer: Self-pay

## 2017-08-30 ENCOUNTER — Emergency Department
Admission: EM | Admit: 2017-08-30 | Discharge: 2017-08-30 | Disposition: A | Discharge status: Home / Self Care | Payer: Worker's Compensation | Attending: Emergency Medicine | Admitting: Emergency Medicine

## 2017-08-30 DIAGNOSIS — E119 Type 2 diabetes mellitus without complications: Secondary | ICD-10-CM | POA: Insufficient documentation

## 2017-08-30 DIAGNOSIS — W208XXA Other cause of strike by thrown, projected or falling object, initial encounter: Secondary | ICD-10-CM | POA: Insufficient documentation

## 2017-08-30 DIAGNOSIS — I10 Essential (primary) hypertension: Secondary | ICD-10-CM | POA: Diagnosis not present

## 2017-08-30 DIAGNOSIS — G44319 Acute post-traumatic headache, not intractable: Secondary | ICD-10-CM | POA: Diagnosis not present

## 2017-08-30 DIAGNOSIS — Z5189 Encounter for other specified aftercare: Secondary | ICD-10-CM

## 2017-08-30 DIAGNOSIS — Z79899 Other long term (current) drug therapy: Secondary | ICD-10-CM | POA: Diagnosis not present

## 2017-08-30 DIAGNOSIS — S161XXA Strain of muscle, fascia and tendon at neck level, initial encounter: Secondary | ICD-10-CM | POA: Diagnosis not present

## 2017-08-30 DIAGNOSIS — S199XXA Unspecified injury of neck, initial encounter: Secondary | ICD-10-CM | POA: Diagnosis present

## 2017-08-30 DIAGNOSIS — S0101XD Laceration without foreign body of scalp, subsequent encounter: Secondary | ICD-10-CM | POA: Diagnosis not present

## 2017-08-30 DIAGNOSIS — Y929 Unspecified place or not applicable: Secondary | ICD-10-CM | POA: Insufficient documentation

## 2017-08-30 DIAGNOSIS — F419 Anxiety disorder, unspecified: Secondary | ICD-10-CM | POA: Diagnosis not present

## 2017-08-30 DIAGNOSIS — Y9389 Activity, other specified: Secondary | ICD-10-CM | POA: Diagnosis not present

## 2017-08-30 DIAGNOSIS — Y998 Other external cause status: Secondary | ICD-10-CM | POA: Diagnosis not present

## 2017-08-30 NOTE — Discharge Instructions (Signed)
Follow-up with your companies workman's comp doctor or [facility] for staple removal. Continue taking your medication only when you are at home as this medication could cause drowsiness. If you are out driving you should be taking Tylenol or ibuprofen as needed for headache or scalp pain.

## 2017-08-30 NOTE — ED Triage Notes (Signed)
Pt here for headache since head injury Wednesday.  Has also started yesterday with "fuzzy" feeling to left eye.  Denies NV.  Pt is unsure if LOC with initial injury.  No imaging done Wednesday.  Here to be re-evaluated because of changes and headaches.  A jack with 18 wheeler transmission fell down and he was standing up and hit him in head (per pt 600-800lb)

## 2017-08-30 NOTE — ED Provider Notes (Signed)
[facility] Emergency Department Provider Note   ____________________________________________   First MD Initiated Contact with Patient 08/30/17 1315     (approximate)  I have reviewed the triage vital signs and the nursing notes.   HISTORY  Chief Complaint Headache   HPI Edward DurhamCory Hamelin is a 31 y.o. male she complained of headache since his injury 2 days ago. Patient was seen in the emergency department for laceration to the left side of his scalp. Patient states that he is concerned about his injury. He states that he has a "fuzzy feeling" to his left eye. He denies any nausea or vomiting. Patient states he is unsure of loss of consciousness 2 days ago. He wishes to be reevaluated for his headaches and changes after a jack and 18 wheeler transmission fell down on him hitting his head. Patient also has remained out of work stating that his prescription bottle says that he is not to drive while taking medication.patient rates his pain as 7 out of 10 currently.   Past Medical History:  Diagnosis Date  . Anxiety   . Diabetes mellitus without complication [facility](HCC) mother and father  . Hyperlipidemia mother  . Hypertension father  . SVT (supraventricular tachycardia) (HCC)     There are no active problems to display for this patient.   History reviewed. No pertinent surgical history.  Prior to Admission medications   Medication Sig Start Date End Date Taking? Authorizing Provider  escitalopram (LEXAPRO) 10 MG tablet Take 10 mg by mouth every morning.  05/18/15   [provider]  metoprolol tartrate (LOPRESSOR) 25 MG tablet Take 0.5 tablets (12.5 mg total) by mouth 2 (two) times daily. Patient not taking: Reported on 06/23/2016 05/30/15   Chilton Siandolph, Tiffany, MD  omeprazole (PRILOSEC) 20 MG capsule Take 20 mg by mouth every morning.     [provider]  traMADol (ULTRAM) 50 MG tablet Take 1 tablet (50 mg total) every 6 (six) hours as needed by  mouth for moderate pain. 08/28/17   Joni ReiningSmith, Ronald K, PA-C    Allergies Penicillins  Family History  Problem Relation Age of Onset  . Hyperlipidemia Mother   . Hypertension Father   . Heart attack Maternal Grandfather     Social History Social History   Tobacco Use  . Smoking status: Never Smoker  . Smokeless tobacco: Never Used  Substance Use Topics  . Alcohol use: No  . Drug use: No    Review of Systems Constitutional: No fever/chills Eyes: left eye "fuzzy". Cardiovascular: Denies chest pain. Respiratory: Denies shortness of breath. Gastrointestinal:   No nausea, no vomiting.  Musculoskeletal: Negative for back pain. Skin: healing laceration. Neurological: positive for headaches,negative for focal weakness or numbness. ____________________________________________   PHYSICAL EXAM:  VITAL SIGNS: ED Triage Vitals  Enc Vitals Group     BP 08/30/17 0920 125/65     Pulse Rate 08/30/17 0920 61     Resp 08/30/17 0920 18     Temp 08/30/17 0920 98.2 F (36.8 C)     Temp Source 08/30/17 0920 Oral     SpO2 08/30/17 0920 96 %     Weight 08/30/17 0921 (!) 310 lb (140.6 kg)     Height 08/30/17 0921 5\' 8"  (1.727 m)     Head Circumference --      Peak Flow --      Pain Score 08/30/17 0919 7     Pain Loc --      Pain Edu? --  Excl. in GC? --    Constitutional: Alert and oriented. Well appearing and in no acute distress. Patient answers questions appropriately. Eyes: Conjunctivae are normal. PERRL. EOMI. Head: Atraumatic. Neck: No stridor.  Minimal tenderness on palpation of cervical spine posteriorly. Patient's range of motion is without restriction. No soft tissue trauma is noted. Cardiovascular: Normal rate, regular rhythm. Grossly normal heart sounds.  Good peripheral circulation. Respiratory: Normal respiratory effort.  No retractions. Lungs CTAB. Musculoskeletal: moves upper and lower extremities without any difficulty. Good muscle strength bilaterally. Normal  gait was noted. Neurologic:  Normal speech and language. No gross focal neurologic deficits are appreciated. No gait instability. Cranial nerves II through XII grossly intact.  Skin:  Skin is warm, dry.  Laceration to the left lateral scalp area has staples and appears to be healing without any signs of infection. Psychiatric: Mood and affect are normal. Speech and behavior are normal.  ____________________________________________   LABS (all labs ordered are listed, but only abnormal results are displayed)  Labs Reviewed - No data to display  RADIOLOGY  Dg Cervical Spine 2-3 Views  Result Date: 08/30/2017 CLINICAL DATA:  Sherrine MaplesBlow to the head 2 days ago when a jack holding a truck transmission fell on the patient. Initial encounter. EXAM: CERVICAL SPINE - 2-3 VIEW COMPARISON:  None. 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 Vertebral body height and alignment are normal. Intervertebral disc space height is normal. Prevertebral soft tissues are normal. IMPRESSION: No acute abnormality. 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 Electronically Signed   By: Drusilla Kannerhomas  Dalessio M.D.   On: 08/30/2017 14:04   Ct Head Wo Contrast  Result Date: 08/30/2017 CLINICAL DATA:  Persistent headaches since a head injury 2 days ago. Hit in head by automotive transmission. EXAM: CT HEAD WITHOUT CONTRAST TECHNIQUE: Contiguous axial images were obtained from the base of the skull through the vertex without intravenous contrast. COMPARISON:  None. FINDINGS: Brain: There is no evidence of acute infarct, intracranial hemorrhage, mass, midline shift, or extra-axial fluid collection. The ventricles and sulci are normal. Vascular: No hyperdense vessel. Skull: No fracture or focal osseous lesion. Left frontal skin staples in place with mild scalp swelling. Sinuses/Orbits: Minimal focal right sphenoid sinus mucosal thickening. Under pneumatized mastoids.  Unremarkable included orbits. Other: None. IMPRESSION: 1. No evidence of acute intracranial abnormality. 2. Mild left-sided scalp swelling. Electronically Signed   By: Sebastian AcheAllen  Grady M.D.   On: 08/30/2017 09:52    ____________________________________________   PROCEDURES  Procedure(s) performed: None  Procedures  Critical Care performed: No  ____________________________________________   INITIAL IMPRESSION / ASSESSMENT AND PLAN / ED COURSE  CT scan was negative. Cervical spine x-rays were reviewed with no new bony injury. Patient was made aware. Patient currently is been taking tramadol which she will continue taking as needed. Patient request a note stating that he was in the emergency department today to take to work. Although the patient was not given a note to remain out of work on his initial visit patient was given a note to return to work without restrictions on 11/12. He was also made aware that he needs to return on 11/14 or to any urgent care to have taples removed.  ____________________________________________   FINAL CLINICAL IMPRESSION(S) / ED DIAGNOSES  Final diagnoses:  Acute post-traumatic headache, not intractable  Cervical strain, acute, initial encounter  Encounter for post-traumatic wound check     ED Discharge Orders    None  Note:  This document was prepared using Dragon voice recognition software and may include unintentional dictation errors.    Tommi Rumps, PA-C 08/30/17 1528    Minna Antis, MD 08/30/17 1534

## 2017-11-14 DIAGNOSIS — R197 Diarrhea, unspecified: Secondary | ICD-10-CM | POA: Diagnosis not present

## 2017-11-14 DIAGNOSIS — Z0000 Encounter for general adult medical examination without abnormal findings: Secondary | ICD-10-CM | POA: Diagnosis not present

## 2017-11-14 DIAGNOSIS — K219 Gastro-esophageal reflux disease without esophagitis: Secondary | ICD-10-CM | POA: Diagnosis not present

## 2018-01-21 ENCOUNTER — Emergency Department: Payer: Commercial Managed Care - HMO

## 2018-01-21 ENCOUNTER — Encounter: Payer: Self-pay | Admitting: Medical Oncology

## 2018-01-21 ENCOUNTER — Emergency Department
Admission: EM | Admit: 2018-01-21 | Discharge: 2018-01-21 | Disposition: A | Discharge status: Home / Self Care | Payer: Commercial Managed Care - HMO | Attending: Emergency Medicine | Admitting: Emergency Medicine

## 2018-01-21 DIAGNOSIS — E119 Type 2 diabetes mellitus without complications: Secondary | ICD-10-CM | POA: Insufficient documentation

## 2018-01-21 DIAGNOSIS — I10 Essential (primary) hypertension: Secondary | ICD-10-CM | POA: Insufficient documentation

## 2018-01-21 DIAGNOSIS — R1030 Lower abdominal pain, unspecified: Secondary | ICD-10-CM | POA: Diagnosis present

## 2018-01-21 DIAGNOSIS — R109 Unspecified abdominal pain: Secondary | ICD-10-CM | POA: Diagnosis not present

## 2018-01-21 DIAGNOSIS — R1110 Vomiting, unspecified: Secondary | ICD-10-CM | POA: Diagnosis not present

## 2018-01-21 DIAGNOSIS — R1032 Left lower quadrant pain: Secondary | ICD-10-CM | POA: Insufficient documentation

## 2018-01-21 DIAGNOSIS — F419 Anxiety disorder, unspecified: Secondary | ICD-10-CM | POA: Insufficient documentation

## 2018-01-21 DIAGNOSIS — R112 Nausea with vomiting, unspecified: Secondary | ICD-10-CM | POA: Diagnosis not present

## 2018-01-21 MED ORDER — ONDANSETRON 4 MG PO TBDP: 4.0000 mg | ORAL_TABLET | Freq: Three times a day (TID) | ORAL | 0 refills | Status: AC | PRN

## 2018-01-21 MED ORDER — IOPAMIDOL (ISOVUE-300) INJECTION 61%
125.0000 mL | Freq: Once | INTRAVENOUS | Status: AC | PRN
  Administered 2018-01-21: 125 mL via INTRAVENOUS

## 2018-01-21 MED ORDER — DICYCLOMINE HCL 20 MG PO TABS: 20.0000 mg | ORAL_TABLET | Freq: Three times a day (TID) | ORAL | 0 refills | Status: AC | PRN

## 2018-01-21 MED ORDER — IOPAMIDOL (ISOVUE-300) INJECTION 61%
30.0000 mL | Freq: Once | INTRAVENOUS | Status: AC | PRN
  Administered 2018-01-21: 30 mL via ORAL

## 2018-01-21 NOTE — ED Provider Notes (Signed)
[facility] Emergency Department Provider Note       Time seen: ----------------------------------------- 12:59 PM on 01/21/2018 -----------------------------------------   I have reviewed the triage vital signs and the nursing notes.  HISTORY   Chief Complaint Abdominal Pain    HPI Edward Atkins is a 32 y.o. male with a history of anxiety, diabetes, hyperlipidemia, hypertension and SVT who presents to the ED for lower abdominal pain that began around midnight last night with nausea and vomiting.  Patient denies any diarrhea, his last bowel movement was yesterday.  He was sent for questionable bowel obstruction.  [facility] which again were concerning for bowel obstruction.  He was noted to have a leukocytosis at that time.  Past Medical History:  Diagnosis Date  . Anxiety   . Diabetes mellitus without complication [facility](HCC) mother and father  . Hyperlipidemia mother  . Hypertension father  . SVT (supraventricular tachycardia) (HCC)     There are no active problems to display for this patient.   History reviewed. No pertinent surgical history.  Allergies Penicillins  Social History Social History   Tobacco Use  . Smoking status: Never Smoker  . Smokeless tobacco: Never Used  Substance Use Topics  . Alcohol use: No  . Drug use: No   Review of Systems Constitutional: Negative for fever. Cardiovascular: Negative for chest pain. Respiratory: Negative for shortness of breath. Gastrointestinal: Positive for abdominal pain, vomiting Genitourinary: Negative for dysuria. Musculoskeletal: Negative for back pain. Skin: Negative for rash. Neurological: Negative for headaches, focal weakness or numbness.  All systems negative/normal/unremarkable except as stated in the HPI  ____________________________________________   PHYSICAL EXAM:  VITAL SIGNS: ED Triage Vitals  Enc Vitals Group     BP 01/21/18 1113  118/75     Pulse Rate 01/21/18 1113 78     Resp 01/21/18 1113 16     Temp 01/21/18 1113 98.4 F (36.9 C)     Temp Source 01/21/18 1113 Oral     SpO2 01/21/18 1113 96 %     Weight 01/21/18 1114 (!) 310 lb (140.6 kg)     Height 01/21/18 1114 5\' 8"  (1.727 m)     Head Circumference --      Peak Flow --      Pain Score 01/21/18 1114 6     Pain Loc --      Pain Edu? --      Excl. in GC? --    Constitutional: Alert and oriented. Well appearing and in no distress. Eyes: Conjunctivae are normal. Normal extraocular movements. ENT   Head: Normocephalic and atraumatic.   Nose: No congestion/rhinnorhea.   Mouth/Throat: Mucous membranes are moist.   Neck: No stridor. Cardiovascular: Normal rate, regular rhythm. No murmurs, rubs, or gallops. Respiratory: Normal respiratory effort without tachypnea nor retractions. Breath sounds are clear and equal bilaterally. No wheezes/rales/rhonchi. Gastrointestinal: Left-sided and left lower quadrant tenderness, no rebound or guarding.  Normal bowel sounds. Musculoskeletal: Nontender with normal range of motion in extremities. No lower extremity tenderness nor edema. Neurologic:  Normal speech and language. No gross focal neurologic deficits are appreciated.  Skin:  Skin is warm, dry and intact. No rash noted. Psychiatric: Mood and affect are normal. Speech and behavior are normal.  ____________________________________________  ED COURSE:  As part of my medical decision making, I reviewed the following data within the electronic MEDICAL RECORD NUMBER History obtained from family if available, nursing notes, old chart and ekg, as well  as notes from prior ED visits. Patient presented for abdominal pain and vomiting, we will assess with labs and imaging as indicated at this time.   Procedures ____________________________________________   RADIOLOGY Images were viewed by me  CT of the abdomen pelvis with contrast Did not reveal any acute  process ____________________________________________  DIFFERENTIAL DIAGNOSIS   Gastroenteritis, ileus, bowel obstruction  FINAL ASSESSMENT AND PLAN  Abdominal pain, vomiting   Plan: The patient had presented for abdominal pain and vomiting which is likely viral.  Patient had been sent here to rule out a bowel obstruction which is effectively ruled out.  Patient's labs did reveal some leukocytosis. Patient's imaging again was normal.  He will be discharged with antiemetics and Bentyl.  He is stable for outpatient follow-up.   Ulice Dash, MD   Note: This note was generated in part or whole with voice recognition software. Voice recognition is usually quite accurate but there are transcription errors that can and very often do occur. I apologize for any typographical errors that were not detected and corrected. Emily Filbert, MD 01/21/18 [phone]

## 2018-01-21 NOTE — ED Triage Notes (Signed)
[facility] with reports that he began having central to left lower abd pain around midnight last night with Nausea and vomiting. Denies diarrhea. Last BM was yesterday. Sent for questionable bowel obstruction. [facility].

## 2018-01-21 NOTE — ED Notes (Signed)
Patient transported to CT 

## 2018-03-06 DIAGNOSIS — K219 Gastro-esophageal reflux disease without esophagitis: Secondary | ICD-10-CM | POA: Diagnosis not present

## 2018-03-06 DIAGNOSIS — J029 Acute pharyngitis, unspecified: Secondary | ICD-10-CM | POA: Diagnosis not present

## 2018-11-06 DIAGNOSIS — H6692 Otitis media, unspecified, left ear: Secondary | ICD-10-CM | POA: Diagnosis not present

## 2019-01-26 ENCOUNTER — Ambulatory Visit
Admission: RE | Admit: 2019-01-26 | Discharge: 2019-01-26 | Disposition: A | Discharge status: Home / Self Care | Payer: PRIVATE HEALTH INSURANCE | Source: Ambulatory Visit | Attending: Family Medicine | Admitting: Family Medicine

## 2019-01-26 ENCOUNTER — Other Ambulatory Visit: Payer: Self-pay

## 2019-01-26 ENCOUNTER — Other Ambulatory Visit: Payer: Self-pay | Admitting: Family Medicine

## 2019-01-26 DIAGNOSIS — R1030 Lower abdominal pain, unspecified: Secondary | ICD-10-CM

## 2019-01-26 DIAGNOSIS — R112 Nausea with vomiting, unspecified: Secondary | ICD-10-CM

## 2019-01-26 MED ORDER — IOPAMIDOL (ISOVUE-370) INJECTION 76%
100.0000 mL | Freq: Once | INTRAVENOUS | Status: AC | PRN
  Administered 2019-01-26: 15:00:00 100 mL via INTRAVENOUS

## 2019-03-17 ENCOUNTER — Other Ambulatory Visit
Admission: RE | Admit: 2019-03-17 | Discharge: 2019-03-17 | Disposition: A | Discharge status: Home / Self Care | Payer: PRIVATE HEALTH INSURANCE | Source: Ambulatory Visit | Attending: Orthopedic Surgery | Admitting: Orthopedic Surgery

## 2019-03-17 DIAGNOSIS — M25561 Pain in right knee: Secondary | ICD-10-CM | POA: Diagnosis not present

## 2019-03-17 LAB — SYNOVIAL CELL COUNT + DIFF, W/ CRYSTALS
Crystals, Fluid: NONE SEEN
Eosinophils-Synovial: 0 %
Lymphocytes-Synovial Fld: 3 %
Monocyte-Macrophage-Synovial Fluid: 30 %
Neutrophil, Synovial: 67 %
WBC, Synovial: 14951 /mm3 — ABNORMAL HIGH (ref 0–200)

## 2019-03-18 ENCOUNTER — Other Ambulatory Visit
Admission: RE | Admit: 2019-03-18 | Discharge: 2019-03-18 | Disposition: A | Discharge status: Home / Self Care | Payer: PRIVATE HEALTH INSURANCE | Source: Ambulatory Visit | Attending: Orthopedic Surgery | Admitting: Orthopedic Surgery

## 2019-03-18 DIAGNOSIS — M25561 Pain in right knee: Secondary | ICD-10-CM | POA: Insufficient documentation

## 2019-03-18 LAB — SYNOVIAL CELL COUNT + DIFF, W/ CRYSTALS
Crystals, Fluid: NONE SEEN
Eosinophils-Synovial: 0 %
Lymphocytes-Synovial Fld: 24 %
Monocyte-Macrophage-Synovial Fluid: 24 %
Neutrophil, Synovial: 52 %
WBC, Synovial: 13378 /mm3 — ABNORMAL HIGH (ref 0–200)

## 2019-03-20 LAB — BODY FLUID CULTURE: Culture: NO GROWTH

## 2019-03-21 LAB — BODY FLUID CULTURE: Culture: NO GROWTH

## 2020-04-04 ENCOUNTER — Encounter: Payer: Self-pay | Admitting: Emergency Medicine

## 2020-04-04 ENCOUNTER — Other Ambulatory Visit: Payer: Self-pay

## 2020-04-04 ENCOUNTER — Emergency Department: Payer: BC Managed Care – PPO

## 2020-04-04 ENCOUNTER — Emergency Department
Admission: EM | Admit: 2020-04-04 | Discharge: 2020-04-04 | Disposition: A | Discharge status: Home / Self Care | Payer: BC Managed Care – PPO | Attending: Emergency Medicine | Admitting: Emergency Medicine

## 2020-04-04 DIAGNOSIS — Z79899 Other long term (current) drug therapy: Secondary | ICD-10-CM | POA: Insufficient documentation

## 2020-04-04 DIAGNOSIS — L03211 Cellulitis of face: Secondary | ICD-10-CM | POA: Diagnosis not present

## 2020-04-04 DIAGNOSIS — R600 Localized edema: Secondary | ICD-10-CM | POA: Diagnosis present

## 2020-04-04 LAB — COMPREHENSIVE METABOLIC PANEL
ALT: 20 U/L (ref 0–44)
AST: 19 U/L (ref 15–41)
Albumin: 3.9 g/dL (ref 3.5–5.0)
Alkaline Phosphatase: 64 U/L (ref 38–126)
Anion gap: 9 (ref 5–15)
BUN: 11 mg/dL (ref 6–20)
CO2: 25 mmol/L (ref 22–32)
Calcium: 8.3 mg/dL — ABNORMAL LOW (ref 8.9–10.3)
Chloride: 104 mmol/L (ref 98–111)
Creatinine, Ser: 0.66 mg/dL (ref 0.61–1.24)
GFR calc Af Amer: >60 mL/min (ref >60)
GFR calc non Af Amer: >60 mL/min (ref >60)
Glucose, Bld: 134 mg/dL — ABNORMAL HIGH (ref 70–99)
Potassium: 3.8 mmol/L (ref 3.5–5.1)
Sodium: 138 mmol/L (ref 135–145)
Total Bilirubin: 1.9 mg/dL — ABNORMAL HIGH (ref 0.3–1.2)
Total Protein: 6.8 g/dL (ref 6.5–8.1)

## 2020-04-04 LAB — CBC WITH DIFFERENTIAL/PLATELET
Abs Immature Granulocytes: 0.1 10*3/uL — ABNORMAL HIGH (ref 0.00–0.07)
Basophils Absolute: 0.1 10*3/uL (ref 0.0–0.1)
Basophils Relative: 1 %
Eosinophils Absolute: 0.2 10*3/uL (ref 0.0–0.5)
Eosinophils Relative: 2 %
HCT: 40.1 % (ref 39.0–52.0)
Hemoglobin: 15 g/dL (ref 13.0–17.0)
Immature Granulocytes: 1 %
Lymphocytes Relative: 21 %
Lymphs Abs: 2.5 10*3/uL (ref 0.7–4.0)
MCH: 30.9 pg (ref 26.0–34.0)
MCHC: 37.4 g/dL — ABNORMAL HIGH (ref 30.0–36.0)
MCV: 82.5 fL (ref 80.0–100.0)
Monocytes Absolute: 1.2 10*3/uL — ABNORMAL HIGH (ref 0.1–1.0)
Monocytes Relative: 10 %
Neutro Abs: 7.9 10*3/uL — ABNORMAL HIGH (ref 1.7–7.7)
Neutrophils Relative %: 65 %
Platelets: 268 10*3/uL (ref 150–400)
RBC: 4.86 MIL/uL (ref 4.22–5.81)
RDW: 13 % (ref 11.5–15.5)
WBC: 11.8 10*3/uL — ABNORMAL HIGH (ref 4.0–10.5)
nRBC: 0 % (ref 0.0–0.2)

## 2020-04-04 LAB — URINALYSIS, COMPLETE (UACMP) WITH MICROSCOPIC
Bacteria, UA: NONE SEEN
Bilirubin Urine: NEGATIVE
Glucose, UA: NEGATIVE mg/dL
Hgb urine dipstick: NEGATIVE
Ketones, ur: NEGATIVE mg/dL
Nitrite: NEGATIVE
Protein, ur: NEGATIVE mg/dL
Specific Gravity, Urine: 1.016 (ref 1.005–1.030)
pH: 6 (ref 5.0–8.0)

## 2020-04-04 LAB — LACTIC ACID, PLASMA: Lactic Acid, Venous: 1.6 mmol/L (ref 0.5–1.9)

## 2020-04-04 IMAGING — CT CT MAXILLOFACIAL W/ CM
3 series · 16 of 47 positions shown, 19 images · IV contrast (omnipaque)
Comparison: Head CT [DATE]

CLINICAL DATA: Right maxillofacial swelling and pain for 3 days.
Dental abscess.

EXAM:
CT MAXILLOFACIAL WITH CONTRAST
TECHNIQUE: Multidetector CT imaging of the maxillofacial structures was
performed with intravenous contrast. Multiplanar CT image
reconstructions were also generated.
CONTRAST:  75mL OMNIPAQUE IOHEXOL 300 MG/ML  SOLN

[Series 2: max soft · axial · 0.36mm/px · z∈[+489,+627]mm · 10 of 81 slices shown, 13 images]
[im 6/81  brain]
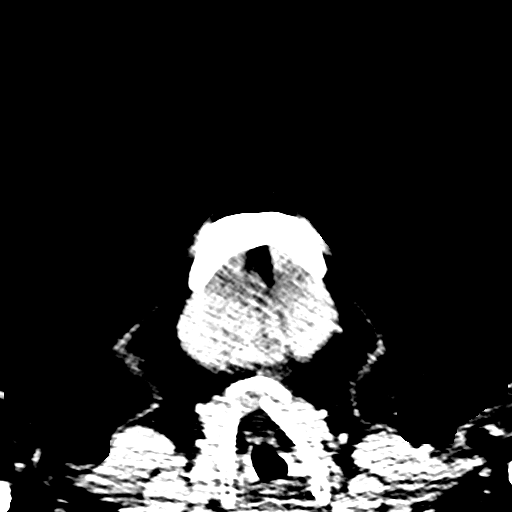
[im 6/81  bone]
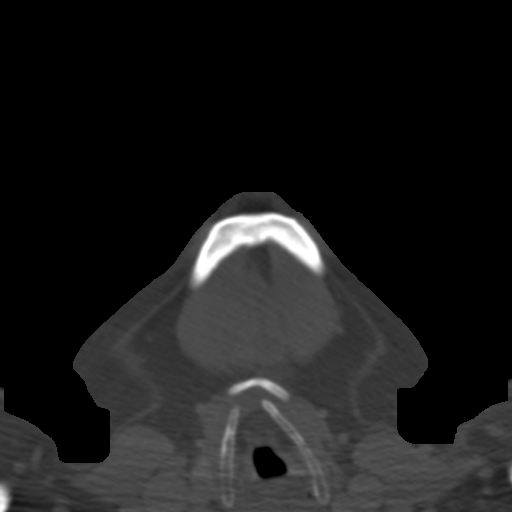
[im 14/81  bone]
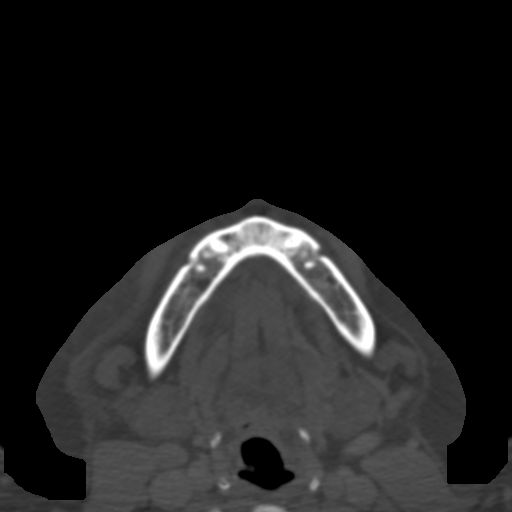
[im 23/81  bone]
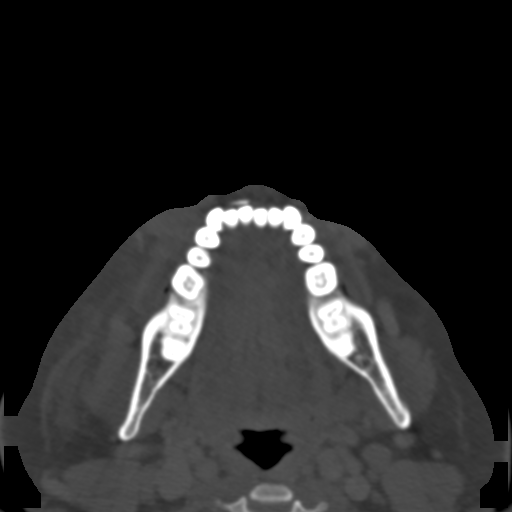
[im 28/81  bone]
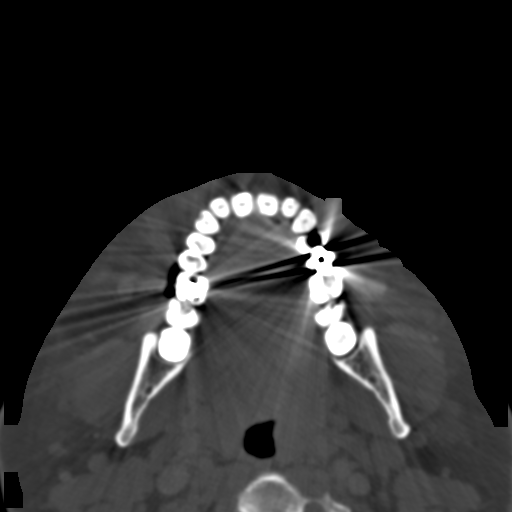
[im 36/81  brain]
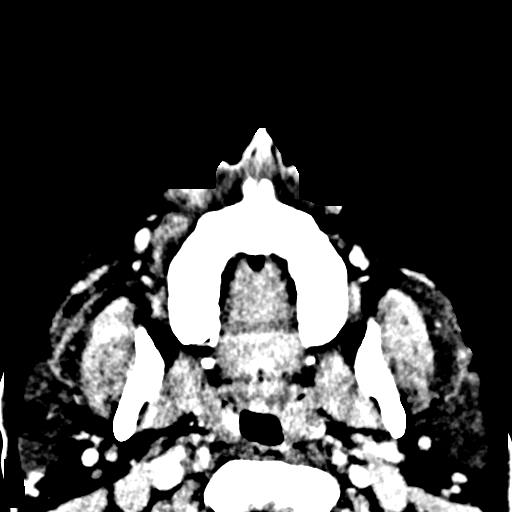
[im 36/81  bone]
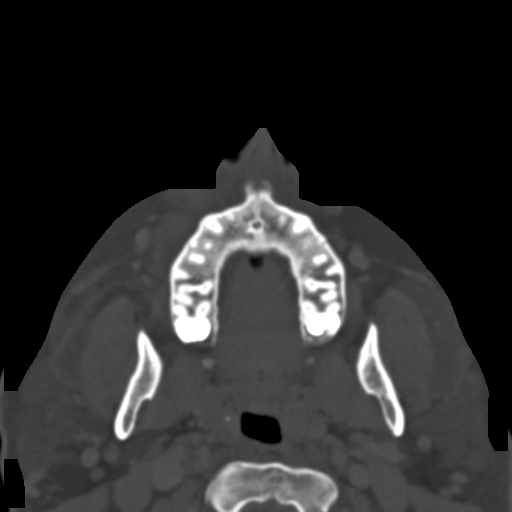
[im 45/81  bone]
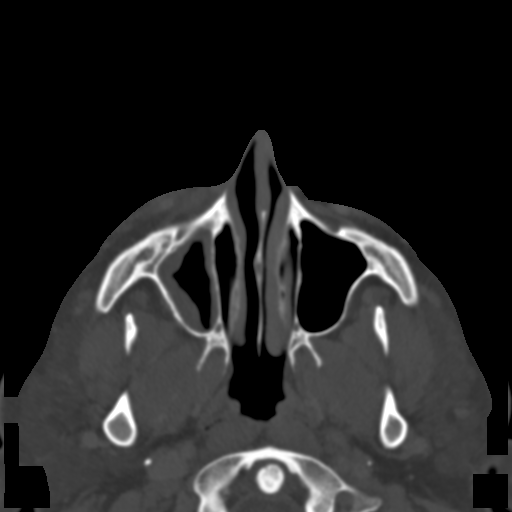
[im 53/81  bone]
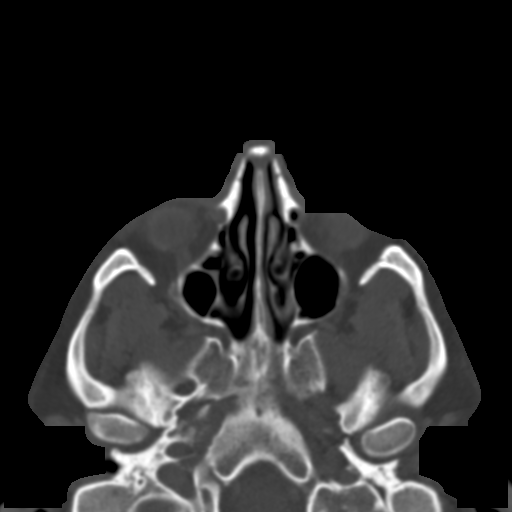
[im 61/81  bone]
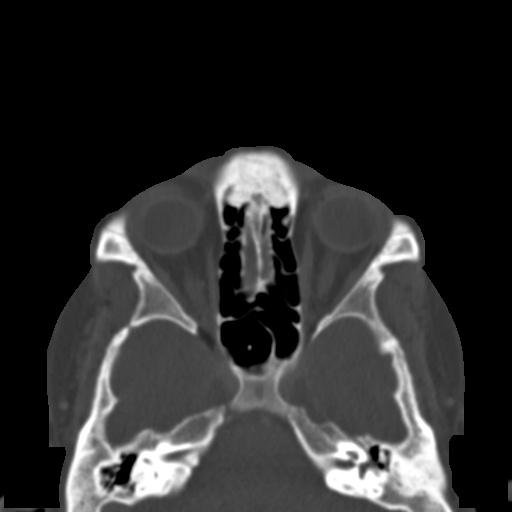
[im 67/81  brain]
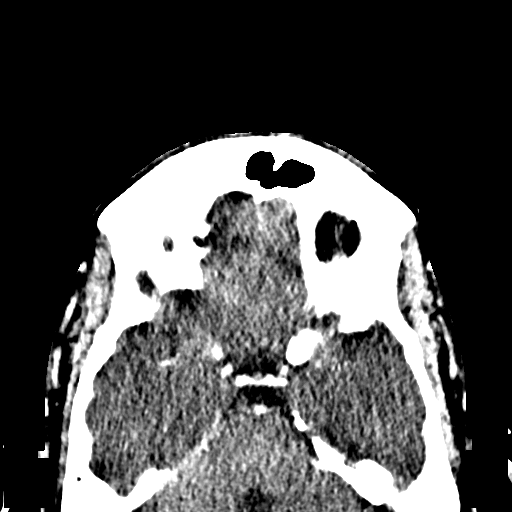
[im 67/81  bone]
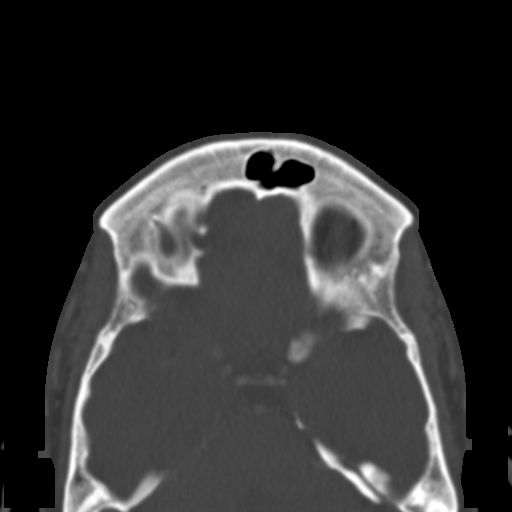
[im 75/81  bone]
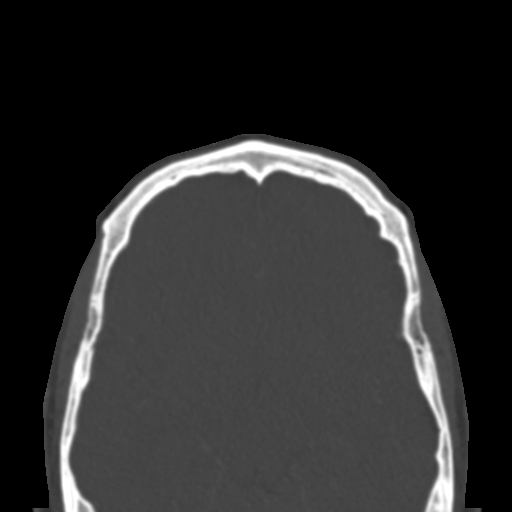

[Series 4: coronal soft · coronal · 0.32mm/px · 3 of 78 slices shown]
[im 26/78  bone]
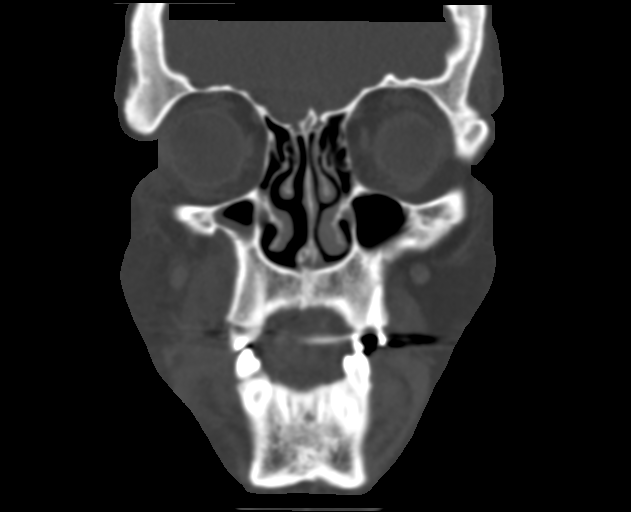
[im 35/78  bone]
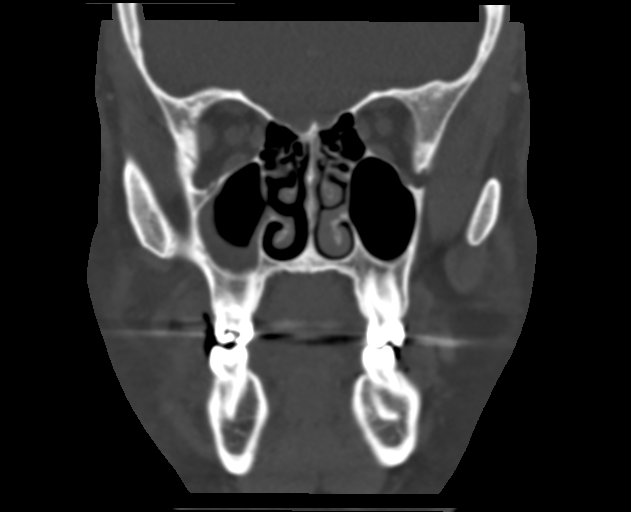
[im 43/78  bone]
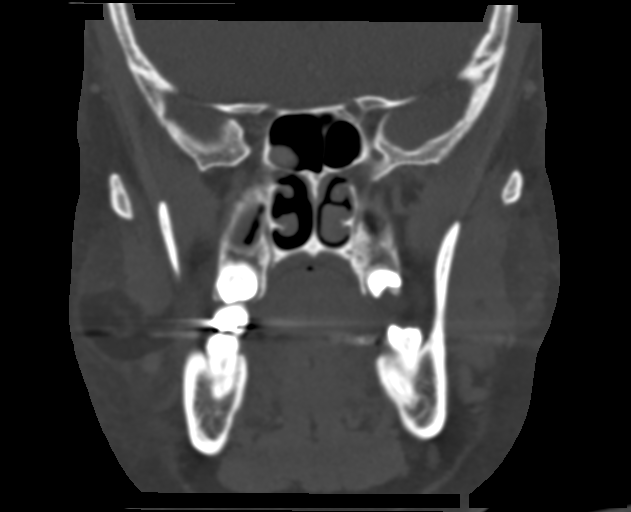

[Series 5: sagittal soft · sagittal · 0.36mm/px · 3 of 95 slices shown]
[im 32/95  bone]
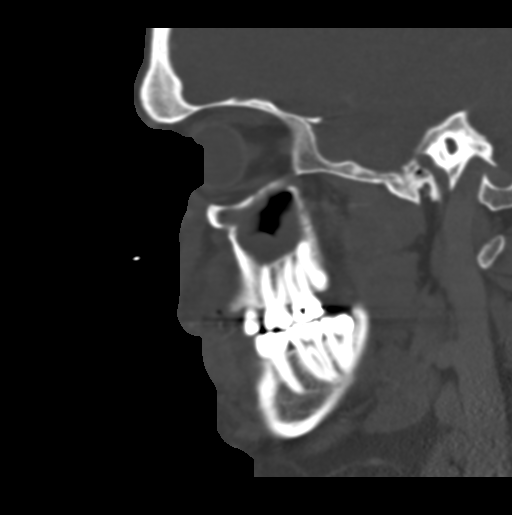
[im 48/95  bone]
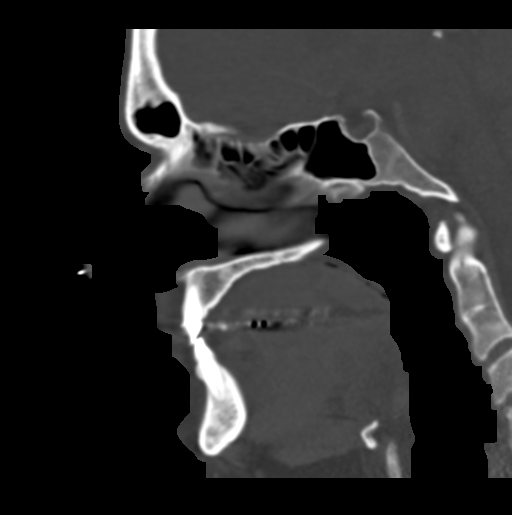
[im 63/95  bone]
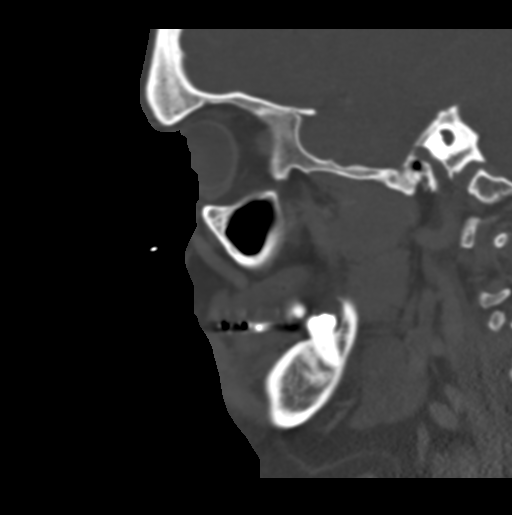

[16 of 47 positions shown; findings below may reference images not displayed]

FINDINGS: Osseous: No fracture. Scattered dental caries. Periapical lucency
involving the left mandibular central incisor without overlying soft
tissue inflammation.

Orbits: Unremarkable.

Sinuses: Mild mucosal thickening involving the right maxillary
greater than right sphenoid sinuses. Underdeveloped and clear
mastoid air cells.

Soft tissues: Moderate, relatively diffuse inflammatory reticulation
throughout the subcutaneous fat of the right face with mild
extension into the posterior right submandibular space. No fluid
collection. No enlarged lymph nodes. Unremarkable parotid and
submandibular glands. Small bilateral palatine tonsillar
calcifications. No retropharyngeal fluid.

Limited intracranial: Unremarkable.
IMPRESSION: Right facial swelling compatible with cellulitis.  No abscess.

## 2020-04-04 MED ORDER — HYDROCORTISONE ACE-PRAMOXINE 1-1 % EX CREA: 1.0000 "application " | TOPICAL_CREAM | Freq: Two times a day (BID) | CUTANEOUS | 0 refills | Status: DC

## 2020-04-04 MED ORDER — IOHEXOL 300 MG/ML  SOLN
75.0000 mL | Freq: Once | INTRAMUSCULAR | Status: AC | PRN
  Administered 2020-04-04: 75 mL via INTRAVENOUS
  Filled 2020-04-04: qty 75

## 2020-04-04 MED ORDER — HYDROMORPHONE HCL 1 MG/ML IJ SOLN
1.0000 mg | Freq: Once | INTRAMUSCULAR | Status: AC
  Administered 2020-04-04: 1 mg via INTRAMUSCULAR
  Filled 2020-04-04: qty 1

## 2020-04-04 MED ORDER — OXYCODONE-ACETAMINOPHEN 7.5-325 MG PO TABS: 1.0000 | ORAL_TABLET | Freq: Four times a day (QID) | ORAL | 0 refills | Status: DC | PRN

## 2020-04-04 MED ORDER — OXYCODONE-ACETAMINOPHEN 7.5-325 MG PO TABS: 1.0000 | ORAL_TABLET | Freq: Four times a day (QID) | ORAL | 0 refills | Status: AC | PRN

## 2020-04-04 MED ORDER — DOCUSATE SODIUM 100 MG PO CAPS: 100.0000 mg | ORAL_CAPSULE | Freq: Every day | ORAL | 0 refills | Status: DC | PRN

## 2020-04-04 MED ORDER — SODIUM CHLORIDE 0.9% FLUSH: 3.0000 mL | Freq: Once | INTRAVENOUS | Status: DC

## 2020-04-04 NOTE — ED Provider Notes (Signed)
[facility] Emergency Department Provider Note   ____________________________________________   First MD Initiated Contact with Patient 04/04/20 [phone]     (approximate)  I have reviewed the triage vital signs and the nursing notes.   HISTORY  Chief Complaint Abscess    HPI Edward Atkins is a 34 y.o. male patient presents with right lateral maxillary edema.  Patient state he is scheduled for root canal in a couple days.  Patient state developing jaw pain with increased edema and called his dentist 3 days ago.  Patient states the dentist prescribed clindamycin and Flagyl.  Patient stated no improvement since taking medications.  Patient called his dentist and was told to come to the ED for further evaluation.  Denies fever.  Rates pain as 8/10.  Described pain as "achy".         Past Medical History:  Diagnosis Date  . Anxiety   . Hyperlipidemia mother  . SVT (supraventricular tachycardia) (HCC)     There are no problems to display for this patient.   No past surgical history on file.  Prior to Admission medications   Medication Sig Start Date End Date Taking? Authorizing Provider  clindamycin (CLEOCIN) 300 MG capsule Take 300 mg by mouth 3 (three) times daily.   Yes [provider]  metroNIDAZOLE (FLAGYL) 500 MG tablet Take 500 mg by mouth 3 (three) times daily.   Yes [provider]  busPIRone (BUSPAR) 7.5 MG tablet Take 1 tablet by mouth 2 (two) times daily. 12/30/17   [provider]  dicyclomine (BENTYL) 20 MG tablet Take 1 tablet (20 mg total) by mouth 3 (three) times daily as needed for spasms. 01/21/18   Emily Filbert, MD  escitalopram (LEXAPRO) 20 MG tablet Take 20 mg by mouth daily.  05/18/15   [provider]  omeprazole (PRILOSEC) 20 MG capsule Take 20 mg by mouth every morning.     [provider]  ondansetron (ZOFRAN ODT) 4 MG disintegrating tablet Take 1 tablet (4 mg total) by mouth  every 8 (eight) hours as needed for nausea or vomiting. 01/21/18   Emily Filbert, MD  oxyCODONE-acetaminophen (PERCOCET) 7.5-325 MG tablet Take 1 tablet by mouth every 6 (six) hours as needed for severe pain. 04/04/20   Joni Reining, PA-C    Allergies Penicillins  Family History  Problem Relation Age of Onset  . Hyperlipidemia Mother   . Hypertension Father   . Heart attack Maternal Grandfather     Social History Social History   Tobacco Use  . Smoking status: Never Smoker  . Smokeless tobacco: Never Used  Substance Use Topics  . Alcohol use: No  . Drug use: No    Review of Systems  Constitutional: No fever/chills Eyes: No visual changes. ENT: No sore throat. Cardiovascular: Denies chest pain. Respiratory: Denies shortness of breath. Gastrointestinal: No abdominal pain.  No nausea, no vomiting.  No diarrhea.  No constipation. Genitourinary: Negative for dysuria. Musculoskeletal: Negative for back pain. Skin: Negative for rash. Neurological: Negative for headaches, focal weakness or numbness. Psychiatric:  Anxiety Endocrine:  Hyperlipidemia Allergic/Immunilogical: Penicillin ____________________________________________   PHYSICAL EXAM:  VITAL SIGNS: ED Triage Vitals  Enc Vitals Group     BP 04/04/20 0800 132/69     Pulse Rate 04/04/20 0800 64     Resp 04/04/20 0800 17     Temp 04/04/20 0800 97.7 F (36.5 C)     Temp Source 04/04/20 0800 Oral     SpO2  04/04/20 0800 98 %     Weight 04/04/20 0756 (!) 309 lb 15.5 oz (140.6 kg)     Height 04/04/20 0756 5\' 8"  (1.727 m)     Head Circumference --      Peak Flow --      Pain Score 04/04/20 0755 8     Pain Loc --      Pain Edu? --      Excl. in GC? --    Constitutional: Alert and oriented. Well appearing and in no acute distress. Mouth/Throat: Mucous membranes are moist.  Oropharynx non-erythematous.  Devitalized right upper molars. Neck: No stridor.  Hematological/Lymphatic/Immunilogical: No cervical  lymphadenopathy. Cardiovascular: Normal rate, regular rhythm. Grossly normal heart sounds.  Good peripheral circulation. Respiratory: Normal respiratory effort.  No retractions. Lungs CTAB. Psychiatric: Mood and affect are normal. Speech and behavior are normal.  ____________________________________________   LABS (all labs ordered are listed, but only abnormal results are displayed)  Labs Reviewed  COMPREHENSIVE METABOLIC PANEL - Abnormal; Notable for the following components:      Result Value   Glucose, Bld 134 (*)    Calcium 8.3 (*)    Total Bilirubin 1.9 (*)    All other components within normal limits  CBC WITH DIFFERENTIAL/PLATELET - Abnormal; Notable for the following components:   WBC 11.8 (*)    MCHC 37.4 (*)    Neutro Abs 7.9 (*)    Monocytes Absolute 1.2 (*)    Abs Immature Granulocytes 0.10 (*)    All other components within normal limits  URINALYSIS, COMPLETE (UACMP) WITH MICROSCOPIC - Abnormal; Notable for the following components:   Color, Urine YELLOW (*)    APPearance CLEAR (*)    Leukocytes,Ua TRACE (*)    All other components within normal limits  LACTIC ACID, PLASMA  LACTIC ACID, PLASMA   ____________________________________________  EKG   ____________________________________________  RADIOLOGY  ED MD interpretation:    Official radiology report(s): CT Maxillofacial W Contrast  Result Date: 04/04/2020 CLINICAL DATA:  Right maxillofacial swelling and pain for 3 days. Dental abscess. EXAM: CT MAXILLOFACIAL WITH CONTRAST TECHNIQUE: Multidetector CT imaging of the maxillofacial structures was performed with intravenous contrast. Multiplanar CT image reconstructions were also generated. CONTRAST:  73mL OMNIPAQUE IOHEXOL 300 MG/ML  SOLN COMPARISON:  Head CT 08/30/2017 FINDINGS: Osseous: No fracture. Scattered dental caries. Periapical lucency involving the left mandibular central incisor without overlying soft tissue inflammation. Orbits: Unremarkable.  Sinuses: Mild mucosal thickening involving the right maxillary greater than right sphenoid sinuses. Underdeveloped and clear mastoid air cells. Soft tissues: Moderate, relatively diffuse inflammatory reticulation throughout the subcutaneous fat of the right face with mild extension into the posterior right submandibular space. No fluid collection. No enlarged lymph nodes. Unremarkable parotid and submandibular glands. Small bilateral palatine tonsillar calcifications. No retropharyngeal fluid. Limited intracranial: Unremarkable. IMPRESSION: Right facial swelling compatible with cellulitis.  No abscess. Electronically Signed   By: 13/06/2017 M.D.   On: 04/04/2020 09:20    ____________________________________________   PROCEDURES  Procedure(s) performed (including Critical Care):  Procedures   ____________________________________________   INITIAL IMPRESSION / ASSESSMENT AND PLAN / ED COURSE  As part of my medical decision making, I reviewed the following data within the electronic MEDICAL RECORD NUMBER     Patient presents with facial edema and pain.  Patient is pending a root canal.  Patient started antibiotics 3 days ago.  Differentials consist dental abscess, acute maxillary sinusitis, or cellulitis.  Discussed labs and CT findings with patient.  Patient advised  to continue antibiotics prescribed by dentist.  Patient is aware is not have a drainable abscess.  Patient will follow up as scheduled dental appointment.    Joanna Hall was evaluated in Emergency Department on 04/04/2020 for the symptoms described in the history of present illness. He was evaluated in the context of the global COVID-19 pandemic, which necessitated consideration that the patient might be at risk for infection with the SARS-CoV-2 virus that causes COVID-19. Institutional protocols and algorithms that pertain to the evaluation of patients at risk for COVID-19 are in a state of rapid change based on information released  by regulatory bodies including the CDC and federal and state organizations. These policies and algorithms were followed during the patient's care in the ED.       ____________________________________________   FINAL CLINICAL IMPRESSION(S) / ED DIAGNOSES  Final diagnoses:  Facial cellulitis     ED Discharge Orders         Ordered    pramoxine-hydrocortisone (PROCTOCREAM-HC) 1-1 % rectal cream  2 times daily,   Status:  Discontinued     Reprint     04/04/20 0950    docusate sodium (COLACE) 100 MG capsule  Daily PRN,   Status:  Discontinued     Reprint     04/04/20 0950    oxyCODONE-acetaminophen (PERCOCET) 7.5-325 MG tablet  Every 6 hours PRN,   Status:  Discontinued     Reprint     04/04/20 0950    oxyCODONE-acetaminophen (PERCOCET) 7.5-325 MG tablet  Every 6 hours PRN     Discontinue  Reprint     04/04/20 1009           Note:  This document was prepared using Dragon voice recognition software and may include unintentional dictation errors.    Sable Feil, PA-C 04/04/20 1012    Arta Silence, MD 04/04/20 [phone]

## 2020-04-04 NOTE — Discharge Instructions (Addendum)
Continue previous antibiotics.  Take ibuprofen and Percocet as needed for pain and swelling.  Follow-up with scheduled dental appointment.

## 2020-04-04 NOTE — ED Triage Notes (Signed)
First Nurse Note:  C/O right upper jaw dental abscess.  States currently taking clindamycin and Flagyl.  Clindamycin since Friday and flagyl since yesterday.  States infection not improving.  Sent for ED eval by dentist.

## 2022-10-10 ENCOUNTER — Ambulatory Visit
Admission: EM | Admit: 2022-10-10 | Discharge: 2022-10-10 | Disposition: A | Discharge status: Home / Self Care | Payer: BC Managed Care – PPO | Attending: Emergency Medicine | Admitting: Emergency Medicine

## 2022-10-10 DIAGNOSIS — J0100 Acute maxillary sinusitis, unspecified: Secondary | ICD-10-CM | POA: Diagnosis not present

## 2022-10-10 DIAGNOSIS — H6693 Otitis media, unspecified, bilateral: Secondary | ICD-10-CM | POA: Diagnosis not present

## 2022-10-10 MED ORDER — AZITHROMYCIN 250 MG PO TABS: 250.0000 mg | ORAL_TABLET | Freq: Every day | ORAL | 0 refills | Status: DC

## 2022-10-10 NOTE — ED Provider Notes (Signed)
Renaldo Fiddler    CSN: [id number] Arrival date & time: 10/10/22  1543      History   Chief Complaint Chief Complaint  Patient presents with   Sore Throat   Headache    HPI Edward Atkins is a 36 y.o. male.  Patient presents with 2 week history of left ear pain, sore throat, congestion, cough; worse since last night.  Treatment at home with OTC cold medications.  No fever, rash, shortness of breath, vomiting, diarrhea, or other symptoms.  His medical history includes hyperlipidemia, SVT, GERD, anxiety, morbid obesity.   The history is provided by the patient and medical records.    Past Medical History:  Diagnosis Date   Anxiety    Hyperlipidemia mother   SVT (supraventricular tachycardia)     There are no problems to display for this patient.   History reviewed. No pertinent surgical history.     Home Medications    Prior to Admission medications   Medication Sig Start Date End Date Taking? Authorizing Provider  azithromycin (ZITHROMAX) 250 MG tablet Take 1 tablet (250 mg total) by mouth daily. Take first 2 tablets together, then 1 every day until finished. 10/10/22  Yes Mickie Bail, NP  busPIRone (BUSPAR) 7.5 MG tablet Take 1 tablet by mouth 2 (two) times daily. 12/30/17   [provider]  dicyclomine (BENTYL) 20 MG tablet Take 1 tablet (20 mg total) by mouth 3 (three) times daily as needed for spasms. 01/21/18   Emily Filbert, MD  escitalopram (LEXAPRO) 20 MG tablet Take 20 mg by mouth daily.  05/18/15   [provider]  omeprazole (PRILOSEC) 20 MG capsule Take 20 mg by mouth every morning.     [provider]  ondansetron (ZOFRAN ODT) 4 MG disintegrating tablet Take 1 tablet (4 mg total) by mouth every 8 (eight) hours as needed for nausea or vomiting. 01/21/18   Emily Filbert, MD  oxyCODONE-acetaminophen (PERCOCET) 7.5-325 MG tablet Take 1 tablet by mouth every 6 (six) hours as needed for severe pain. 04/04/20   Joni Reining, PA-C    Family History Family History  Problem Relation Age of Onset   Hyperlipidemia Mother    Hypertension Father    Heart attack Maternal Grandfather     Social History Social History   Tobacco Use   Smoking status: Never   Smokeless tobacco: Never  Substance Use Topics   Alcohol use: No   Drug use: No     Allergies   Penicillins   Review of Systems Review of Systems  Constitutional:  Negative for chills and fever.  HENT:  Positive for congestion, ear discharge and sore throat. Negative for ear pain.   Respiratory:  Positive for cough. Negative for shortness of breath.   Cardiovascular:  Negative for chest pain and palpitations.  Gastrointestinal:  Negative for diarrhea and vomiting.  Skin:  Negative for color change and rash.  All other systems reviewed and are negative.    Physical Exam Triage Vital Signs ED Triage Vitals  Enc Vitals Group     BP 10/10/22 1603 131/76     Pulse Rate 10/10/22 1558 78     Resp 10/10/22 1558 18     Temp 10/10/22 1558 98.3 F (36.8 C)     Temp src --      SpO2 10/10/22 1558 95 %     Weight 10/10/22 1601 (!) 336 lb (152.4 kg)     Height 10/10/22 1601 5'  8" (1.727 m)     Head Circumference --      Peak Flow --      Pain Score 10/10/22 1558 6     Pain Loc --      Pain Edu? --      Excl. in GC? --    No data found.  Updated Vital Signs BP 131/76   Pulse 78   Temp 98.3 F (36.8 C)   Resp 18   Ht 5\' 8"  (1.727 m)   Wt (!) 336 lb (152.4 kg)   SpO2 95%   BMI 51.09 kg/m   Visual Acuity Right Eye Distance:   Left Eye Distance:   Bilateral Distance:    Right Eye Near:   Left Eye Near:    Bilateral Near:     Physical Exam Vitals and nursing note reviewed.  Constitutional:      General: He is not in acute distress.    Appearance: He is well-developed. He is obese. He is not ill-appearing.  HENT:     Right Ear: Tympanic membrane is erythematous.     Left Ear: Tympanic membrane is erythematous.      Nose: Congestion present.     Mouth/Throat:     Mouth: Mucous membranes are moist.     Pharynx: Oropharynx is clear.  Cardiovascular:     Rate and Rhythm: Normal rate and regular rhythm.     Heart sounds: Normal heart sounds.  Pulmonary:     Effort: Pulmonary effort is normal. No respiratory distress.     Breath sounds: Normal breath sounds.  Musculoskeletal:     Cervical back: Neck supple.  Skin:    General: Skin is warm and dry.  Neurological:     Mental Status: He is alert.  Psychiatric:        Mood and Affect: Mood normal.        Behavior: Behavior normal.      UC Treatments / Results  Labs (all labs ordered are listed, but only abnormal results are displayed) Labs Reviewed - No data to display  EKG   Radiology No results found.  Procedures Procedures (including critical care time)  Medications Ordered in UC Medications - No data to display  Initial Impression / Assessment and Plan / UC Course  I have reviewed the triage vital signs and the nursing notes.  Pertinent labs & imaging results that were available during my care of the patient were reviewed by me and considered in my medical decision making (see chart for details).    Bilateral otitis media, acute sinusitis.  Treating with Zithromax as patient reports this has worked well for him in the past.  Discussed symptomatic treatment including Tylenol or ibuprofen, rest, hydration.  Instructed patient to follow up with PCP if symptoms are not improving.  He agrees to plan of care.   Final Clinical Impressions(s) / UC Diagnoses   Final diagnoses:  Bilateral otitis media, unspecified otitis media type  Acute non-recurrent maxillary sinusitis     Discharge Instructions      Take the Zithromax as directed.  Follow up with your primary care provider if your symptoms are not improving.        ED Prescriptions     Medication Sig Dispense Auth. Provider   azithromycin (ZITHROMAX) 250 MG tablet Take 1  tablet (250 mg total) by mouth daily. Take first 2 tablets together, then 1 every day until finished. 6 tablet , NP  PDMP not reviewed this encounter.   Mickie Bail, NP 10/10/22 1630

## 2022-10-10 NOTE — ED Triage Notes (Signed)
Patient to Urgent Care with complaints of sore throat, headache, and left sided ear congestion x2 weeks. Reports productive cough x2 weeks.  Denies any known fevers. Reports he was feeling better yesterday but this morning woke up feeling worse.   Has been taking mucinex/ dayquil /nyquil.

## 2022-10-10 NOTE — Discharge Instructions (Addendum)
Take the Zithromax as directed.  Follow up with your primary care provider if your symptoms are not improving.    

## 2024-03-18 ENCOUNTER — Ambulatory Visit
Admission: EM | Admit: 2024-03-18 | Discharge: 2024-03-18 | Disposition: A | Discharge status: Home / Self Care | Attending: Emergency Medicine | Admitting: Emergency Medicine

## 2024-03-18 DIAGNOSIS — J020 Streptococcal pharyngitis: Secondary | ICD-10-CM

## 2024-03-18 LAB — POCT RAPID STREP A (OFFICE): Rapid Strep A Screen: POSITIVE — AB

## 2024-03-18 MED ORDER — AZITHROMYCIN 250 MG PO TABS: 250.0000 mg | ORAL_TABLET | Freq: Every day | ORAL | 0 refills | Status: AC

## 2024-03-18 NOTE — ED Provider Notes (Signed)
 Edward Atkins    CSN: [id number] Arrival date & time: 03/18/24  4098      History   Chief Complaint Chief Complaint  Patient presents with   Sore Throat    HPI Edward Atkins is a 38 y.o. male.   Patient presents for evaluation of generalized bodyaches, sore throat and left-sided ear pain beginning yesterday evening.  Known exposure to strep within household.  Has not attempted to treatment.  Decreased appetite but tolerating fluids.  Denies fever, cough or congestion.  Past Medical History:  Diagnosis Date   Anxiety    Hyperlipidemia mother   SVT (supraventricular tachycardia) (HCC)     There are no active problems to display for this patient.   History reviewed. No pertinent surgical history.     Home Medications    Prior to Admission medications   Medication Sig Start Date End Date Taking? Authorizing Provider  azithromycin  (ZITHROMAX ) 250 MG tablet Take 1 tablet (250 mg total) by mouth daily. Take first 2 tablets together, then 1 every day until finished. 03/18/24  Yes Jahmel Flannagan R, NP  busPIRone (BUSPAR) 7.5 MG tablet Take 1 tablet by mouth 2 (two) times daily. 12/30/17  Yes [provider]  dicyclomine  (BENTYL ) 20 MG tablet Take 1 tablet (20 mg total) by mouth 3 (three) times daily as needed for spasms. 01/21/18   Shayne Demark, MD  escitalopram (LEXAPRO) 20 MG tablet Take 20 mg by mouth daily.  05/18/15  Yes [provider]  omeprazole (PRILOSEC) 20 MG capsule Take 20 mg by mouth every morning.    Yes [provider]  ondansetron  (ZOFRAN  ODT) 4 MG disintegrating tablet Take 1 tablet (4 mg total) by mouth every 8 (eight) hours as needed for nausea or vomiting. 01/21/18   Shayne Demark, MD  oxyCODONE -acetaminophen  (PERCOCET) 7.5-325 MG tablet Take 1 tablet by mouth every 6 (six) hours as needed for severe pain. 04/04/20   Marcina Severe, PA-C    Family History Family History  Problem Relation Age of Onset    Hyperlipidemia Mother    Hypertension Father    Heart attack Maternal Grandfather     Social History Social History   Tobacco Use   Smoking status: Never   Smokeless tobacco: Never  Vaping Use   Vaping status: Never Used  Substance Use Topics   Alcohol use: No   Drug use: No     Allergies   Penicillins   Review of Systems Review of Systems   Physical Exam Triage Vital Signs ED Triage Vitals  Encounter Vitals Group     BP 03/18/24 0838 134/81     Systolic BP Percentile --      Diastolic BP Percentile --      Pulse Rate 03/18/24 0838 90     Resp 03/18/24 0838 18     Temp 03/18/24 0838 99.2 F (37.3 C)     Temp Source 03/18/24 0838 Oral     SpO2 03/18/24 0838 97 %     Weight --      Height --      Head Circumference --      Peak Flow --      Pain Score 03/18/24 0839 9     Pain Loc --      Pain Education --      Exclude from Growth Chart --    No data found.  Updated Vital Signs BP 134/81 (BP Location: Left Arm)   Pulse 90  Temp 99.2 F (37.3 C) (Oral)   Resp 18   SpO2 97%   Visual Acuity Right Eye Distance:   Left Eye Distance:   Bilateral Distance:    Right Eye Near:   Left Eye Near:    Bilateral Near:     Physical Exam Constitutional:      Appearance: He is well-developed.  HENT:     Head: Normocephalic.     Right Ear: Tympanic membrane and ear canal normal.     Left Ear: Tympanic membrane and ear canal normal.     Nose: No congestion or rhinorrhea.     Mouth/Throat:     Pharynx: Posterior oropharyngeal erythema present. No oropharyngeal exudate.     Tonsils: No tonsillar exudate.  Musculoskeletal:     Cervical back: Normal range of motion and neck supple.  Neurological:     General: No focal deficit present.     Mental Status: He is alert and oriented to person, place, and time.      UC Treatments / Results  Labs (all labs ordered are listed, but only abnormal results are displayed) Labs Reviewed  POCT RAPID STREP A (OFFICE)  - Abnormal; Notable for the following components:      Result Value   Rapid Strep A Screen Positive (*)    All other components within normal limits    EKG   Radiology No results found.  Procedures Procedures (including critical care time)  Medications Ordered in UC Medications - No data to display  Initial Impression / Assessment and Plan / UC Course  I have reviewed the triage vital signs and the nursing notes.  Pertinent labs & imaging results that were available during my care of the patient were reviewed by me and considered in my medical decision making (see chart for details).  Strep pharyngitis  Rapid testing positive, discussed with patient, prescribed azithromycin  , may attempt salt water gargles, throat lozenges, warm to cool liquids per preference, over-the-counter Chloraseptic spray and over-the-counter analgesics for management of discomfort, may follow-up with urgent care as needed if symptoms persist or worsen, note given  Final Clinical Impressions(s) / UC Diagnoses   Final diagnoses:  Strep pharyngitis   Discharge Instructions      Your rapid strep test today was positive  Take azithromycin , daily will see improvement in about 48 hours and steady progression from there  may use of salt gargles throat lozenges, warm liquids, teaspoons of honey and over-the-counter chloraseptic spray for comfort  May give Tylenol  or Motrin every 6 hours as needed for additional comfort  You may follow-up at urgent care as needed     ED Prescriptions     Medication Sig Dispense Auth. Provider   azithromycin  (ZITHROMAX ) 250 MG tablet Take 1 tablet (250 mg total) by mouth daily. Take first 2 tablets together, then 1 every day until finished. 6 tablet Elanda Garmany R, NP      PDMP not reviewed this encounter.   Reena Canning, [place] 03/18/24 [phone]

## 2024-03-18 NOTE — ED Triage Notes (Signed)
 Sore throat, body aches, left ear pain x 1 day.   Pt states his daughter was positive for strep throat 2 days ago.

## 2024-03-18 NOTE — Discharge Instructions (Signed)
 Your rapid strep test today was positive  Take azithromycin , daily will see improvement in about 48 hours and steady progression from there  may use of salt gargles throat lozenges, warm liquids, teaspoons of honey and over-the-counter chloraseptic spray for comfort  May give Tylenol  or Motrin every 6 hours as needed for additional comfort  You may follow-up at urgent care as needed
